# Patient Record
Sex: Male | Born: 1940
Health system: Southern US, Community
[De-identification: ages and names within clinical notes are randomized; demographics above are authoritative.]

## PROBLEM LIST (undated history)

## (undated) DIAGNOSIS — I10 Essential (primary) hypertension: Secondary | ICD-10-CM

## (undated) DIAGNOSIS — D126 Benign neoplasm of colon, unspecified: Secondary | ICD-10-CM

## (undated) DIAGNOSIS — Q231 Congenital insufficiency of aortic valve: Secondary | ICD-10-CM

## (undated) DIAGNOSIS — Q2381 Bicuspid aortic valve: Secondary | ICD-10-CM

## (undated) DIAGNOSIS — R931 Abnormal findings on diagnostic imaging of heart and coronary circulation: Secondary | ICD-10-CM

## (undated) DIAGNOSIS — E785 Hyperlipidemia, unspecified: Secondary | ICD-10-CM

## (undated) DIAGNOSIS — I700 Atherosclerosis of aorta: Secondary | ICD-10-CM

## (undated) DIAGNOSIS — I77819 Aortic ectasia, unspecified site: Secondary | ICD-10-CM

## (undated) HISTORY — DX: Benign neoplasm of colon, unspecified: D12.6

## (undated) HISTORY — PX: COLONOSCOPY W/ POLYPECTOMY: SHX1380

## (undated) HISTORY — PX: TONSILLECTOMY: SUR1361

## (undated) HISTORY — DX: Atherosclerosis of aorta: I70.0

## (undated) HISTORY — DX: Hyperlipidemia, unspecified: E78.5

## (undated) HISTORY — DX: Abnormal findings on diagnostic imaging of heart and coronary circulation: R93.1

## (undated) HISTORY — DX: Aortic ectasia, unspecified site: I77.819

## (undated) HISTORY — DX: Essential (primary) hypertension: I10

## (undated) HISTORY — DX: Bicuspid aortic valve: Q23.81

## (undated) HISTORY — PX: VASECTOMY: SHX75

## (undated) HISTORY — DX: Congenital insufficiency of aortic valve: Q23.1

## (undated) HISTORY — PX: UMBILICAL HERNIA REPAIR: SHX196

---

## 2011-05-25 LAB — PULMONARY FUNCTION TEST

## 2011-06-09 ENCOUNTER — Encounter: Payer: Self-pay | Admitting: Internal Medicine

## 2011-06-10 ENCOUNTER — Encounter: Payer: Self-pay | Admitting: Internal Medicine

## 2011-06-10 ENCOUNTER — Ambulatory Visit (INDEPENDENT_AMBULATORY_CARE_PROVIDER_SITE_OTHER): Payer: BC Managed Care – PPO | Admitting: Internal Medicine

## 2011-06-10 VITALS — BP 112/80 | HR 81 | Temp 98.3°F | Ht 73.0 in | Wt 227.0 lb

## 2011-06-10 DIAGNOSIS — J849 Interstitial pulmonary disease, unspecified: Secondary | ICD-10-CM | POA: Insufficient documentation

## 2011-06-10 DIAGNOSIS — R05 Cough: Secondary | ICD-10-CM

## 2011-06-10 MED ORDER — FAMOTIDINE 20 MG PO TABS: ORAL_TABLET | ORAL | Status: DC

## 2011-06-10 MED ORDER — PREDNISONE (PAK) 10 MG PO TABS: ORAL_TABLET | ORAL | Status: AC

## 2011-06-10 MED ORDER — TRAMADOL HCL 50 MG PO TABS: ORAL_TABLET | ORAL | Status: AC

## 2011-06-10 NOTE — Progress Notes (Signed)
  Subjective:    Patient ID: Stephen [name], male    DOB: 03/19/1941  MRN: [id number]  HPI  68 yowm quit smoking age late 20's with somewhat of a smoker's cough gradually improved p quit smoking and fine until Jan 2013 with onset of persistent cough so referred by [facility] 06/10/2011 to pulmonary clinic  06/10/2011 1st pulmonary eval cc indolent onset dry cough x 6 weeks waxed and waned really not progressive some better p course of prednisone, maintained now on spiriva , nexium, no better with saba.  Maybe some worse day vs night, sometimes when sits down, not assoc with chest tightness, sob, sinus or HB symptoms.  Sleeping ok without nocturnal  or early am exacerbation  of respiratory  c/o's or need for noct saba. Also denies any obvious fluctuation of symptoms with weather or environmental changes or other aggravating or alleviating factors except as outlined above    Review of Systems  Constitutional: Negative for fever, chills, activity change, appetite change and unexpected weight change.  HENT: Positive for congestion. Negative for sore throat, rhinorrhea, sneezing, trouble swallowing, dental problem, voice change and postnasal drip.   Eyes: Negative for visual disturbance.  Respiratory: Positive for cough. Negative for choking and shortness of breath.   Cardiovascular: Negative for chest pain and leg swelling.  Gastrointestinal: Negative for nausea, vomiting and abdominal pain.  Genitourinary: Negative for difficulty urinating.  Musculoskeletal: Negative for arthralgias.  Skin: Negative for rash.  Psychiatric/Behavioral: Negative for behavioral problems and confusion.       Objective:   Physical Exam  amb pleasant wm nad  227 06/10/11 HEENT: nl dentition, turbinates, and orophanx. Nl external ear canals without cough reflex   NECK :  without JVD/Nodes/TM/ nl carotid upstrokes bilaterally   LUNGS: no acc muscle use, clear to A and P bilaterally without cough on insp or exp  maneuvers   CV:  RRR  no s3 or murmur or increase in P2, no edema   ABD:  soft and nontender with nl excursion in the supine position. No bruits or organomegaly, bowel sounds nl  MS:  warm without deformities, calf tenderness, cyanosis or clubbing  SKIN: warm and dry without lesions    NEURO:  alert, approp, no deficits   cxr 05/25/11 "cm/copd" by report  Spirometry 05/25/11 no airflow obst     Assessment & Plan:

## 2011-06-10 NOTE — Patient Instructions (Signed)
The key to effective treatment for your cough is eliminating the non-stop cycle of cough you're stuck in long enough to let your airway heal completely and then see if there is anything still making you cough once you stop the cough suppression, but this should take no more than 5 days to figuure out  First take delsym two tsp every 12 hours and supplement if needed with  tramadol 50 mg up to 2 every 4 hours to suppress the urge to cough. Swallowing water or using ice chips/non mint and menthol containing candies (such as lifesavers or sugarless jolly ranchers) are also effective.  You should rest your voice and avoid activities that you know make you cough.  Once you have eliminated the cough for 3 straight days try reducing the tramadol first,  then the delsym as tolerated.    Try Nexium  Take 30-60 min before first meal of the day and Pepcid 20 mg one bedtime until cough is completely gone for at least a week without the need for cough suppression  I think of reflux for chronic cough like I do oxygen for fire (doesn't cause the fire but once you get the oxygen suppressed it usually goes away regardless of the exact cause).  GERD (REFLUX)  is an extremely common cause of respiratory symptoms, many times with no significant heartburn at all.    It can be treated with medication, but also with lifestyle changes including avoidance of late meals, excessive alcohol, smoking cessation, and avoid fatty foods, chocolate, peppermint, colas, red wine, and acidic juices such as orange juice.  NO MINT OR MENTHOL PRODUCTS SO NO COUGH DROPS  USE SUGARLESS CANDY INSTEAD (jolley ranchers or Stover's)  NO OIL BASED VITAMINS - use powdered substitutes.    Prednisone 10 mg take  4 each am x 2 days,   2 each am x 2 days,  1 each am x2days and stop     If you are satisfied with your treatment plan let your doctor know    If in any way you are not 100% satisfied,  please tell us.  If 100% better, tell your  friends!

## 2011-06-11 NOTE — Assessment & Plan Note (Signed)
The most common causes of chronic cough in immunocompetent adults include the following: upper airway cough syndrome (UACS), previously referred to as postnasal drip syndrome (PNDS), which is caused by variety of rhinosinus conditions; (2) asthma; (3) GERD; (4) chronic bronchitis from cigarette smoking or other inhaled environmental irritants; (5) nonasthmatic eosinophilic bronchitis; and (6) bronchiectasis.   These conditions, singly or in combination, have accounted for up to 94% of the causes of chronic cough in prospective studies.   Other conditions have constituted no >6% of the causes in prospective studies These have included bronchogenic carcinoma, chronic interstitial pneumonia, sarcoidosis, left ventricular failure, ACEI-induced cough, and aspiration from a condition associated with pharyngeal dysfunction.   .Chronic cough is often simultaneously caused by more than one condition. A single cause has been found from 38 to 82% of the time, multiple causes from 18 to 62%. Multiply caused cough has been the result of three diseases up to 42% of the time.     Of the three most common causes of chronic cough, only one (GERD)  can actually cause the other two (asthma and post nasal drip syndrome)  and perpetuate the cylce of cough inducing airway trauma, inflammation, heightened sensitivity to reflux which is prompted by the cough itself via a cyclical mechanism.    This may partially respond to steroids and look like asthma and post nasal drainage but never erradicated completely unless the cough and the secondary reflux are eliminated, preferably both at the same time.  While not intuitively obvious, many patients with chronic low grade reflux do not cough until there is a secondary insult that disturbs the protective epithelial barrier and exposes sensitive nerve endings.  This can be viral or direct physical injury such as with an endotracheal tube.   The point is that once this occurs, it is  difficult to eliminate using anything but a maximally effective acid suppression regimen at least in the short run, accompanied by an appropriate diet to address non acid GERD.   If not better p cyclical cough need to regroup with sinus ct and methacholine challenge  In absence of airflow obst will ask him to stop all inhalers.  Discussed with pt  The standardized cough guidelines published in Chest by [place] in 2006  are a multiple step process (up to 12!) , not a single office visit,  and are intended  to address this problem logically,  with an alogrithm dependent on response to empiric treatment at  each progressive step  to determine a specific diagnosis with  minimal addtional testing needed. Therefore if compliance is an issue or can't be accurately verified then it's very unlikely the standard evaluation and treatment will be successful here.    Furthermore, response to therapy (other than acute cough suppression, which should only be used short term with avoidance of narcotic containing cough syrups if possible), can be a gradual process for which the patient may not receive immediate benefit.  Unlike going to an eye doctor where the right rx is almost always the first one and is immediately effective, this is almost never the case in the management of chronic cough syndromes and the patient needs to commit up front to compliance with recommendations and have the patience to wait out a response for up to 6 weeks of therapy directed at the likely underlying problem(s).

## 2012-01-10 DIAGNOSIS — H25049 Posterior subcapsular polar age-related cataract, unspecified eye: Secondary | ICD-10-CM | POA: Diagnosis not present

## 2012-01-10 DIAGNOSIS — H2510 Age-related nuclear cataract, unspecified eye: Secondary | ICD-10-CM | POA: Diagnosis not present

## 2012-01-16 DIAGNOSIS — Z23 Encounter for immunization: Secondary | ICD-10-CM | POA: Diagnosis not present

## 2012-01-16 DIAGNOSIS — I719 Aortic aneurysm of unspecified site, without rupture: Secondary | ICD-10-CM | POA: Diagnosis not present

## 2012-01-16 DIAGNOSIS — I10 Essential (primary) hypertension: Secondary | ICD-10-CM | POA: Diagnosis not present

## 2012-01-16 DIAGNOSIS — Q231 Congenital insufficiency of aortic valve: Secondary | ICD-10-CM | POA: Diagnosis not present

## 2012-01-16 DIAGNOSIS — E669 Obesity, unspecified: Secondary | ICD-10-CM | POA: Diagnosis not present

## 2012-01-17 DIAGNOSIS — H3530 Unspecified macular degeneration: Secondary | ICD-10-CM | POA: Diagnosis not present

## 2012-01-17 DIAGNOSIS — H2510 Age-related nuclear cataract, unspecified eye: Secondary | ICD-10-CM | POA: Diagnosis not present

## 2012-01-17 DIAGNOSIS — H02839 Dermatochalasis of unspecified eye, unspecified eyelid: Secondary | ICD-10-CM | POA: Diagnosis not present

## 2012-01-17 DIAGNOSIS — H52209 Unspecified astigmatism, unspecified eye: Secondary | ICD-10-CM | POA: Diagnosis not present

## 2012-02-08 DIAGNOSIS — I10 Essential (primary) hypertension: Secondary | ICD-10-CM | POA: Diagnosis not present

## 2012-02-08 DIAGNOSIS — J309 Allergic rhinitis, unspecified: Secondary | ICD-10-CM | POA: Diagnosis not present

## 2012-02-08 DIAGNOSIS — Z125 Encounter for screening for malignant neoplasm of prostate: Secondary | ICD-10-CM | POA: Diagnosis not present

## 2012-02-08 DIAGNOSIS — D126 Benign neoplasm of colon, unspecified: Secondary | ICD-10-CM | POA: Diagnosis not present

## 2012-02-08 DIAGNOSIS — Z1331 Encounter for screening for depression: Secondary | ICD-10-CM | POA: Diagnosis not present

## 2012-02-08 DIAGNOSIS — Z23 Encounter for immunization: Secondary | ICD-10-CM | POA: Diagnosis not present

## 2012-02-08 DIAGNOSIS — Z0000 Encounter for general adult medical examination without abnormal findings: Secondary | ICD-10-CM | POA: Diagnosis not present

## 2012-02-09 DIAGNOSIS — Z0000 Encounter for general adult medical examination without abnormal findings: Secondary | ICD-10-CM | POA: Diagnosis not present

## 2012-02-09 DIAGNOSIS — I10 Essential (primary) hypertension: Secondary | ICD-10-CM | POA: Diagnosis not present

## 2012-02-09 DIAGNOSIS — J309 Allergic rhinitis, unspecified: Secondary | ICD-10-CM | POA: Diagnosis not present

## 2012-02-09 DIAGNOSIS — Z125 Encounter for screening for malignant neoplasm of prostate: Secondary | ICD-10-CM | POA: Diagnosis not present

## 2012-02-09 DIAGNOSIS — D126 Benign neoplasm of colon, unspecified: Secondary | ICD-10-CM | POA: Diagnosis not present

## 2012-02-15 DIAGNOSIS — H3530 Unspecified macular degeneration: Secondary | ICD-10-CM | POA: Diagnosis not present

## 2012-02-15 DIAGNOSIS — H25019 Cortical age-related cataract, unspecified eye: Secondary | ICD-10-CM | POA: Diagnosis not present

## 2012-02-15 DIAGNOSIS — H2181 Floppy iris syndrome: Secondary | ICD-10-CM | POA: Diagnosis not present

## 2012-02-15 DIAGNOSIS — H2510 Age-related nuclear cataract, unspecified eye: Secondary | ICD-10-CM | POA: Diagnosis not present

## 2012-02-15 DIAGNOSIS — H2589 Other age-related cataract: Secondary | ICD-10-CM | POA: Diagnosis not present

## 2012-03-07 DIAGNOSIS — Z23 Encounter for immunization: Secondary | ICD-10-CM | POA: Diagnosis not present

## 2012-04-13 DIAGNOSIS — L723 Sebaceous cyst: Secondary | ICD-10-CM | POA: Diagnosis not present

## 2012-05-02 DIAGNOSIS — Z8669 Personal history of other diseases of the nervous system and sense organs: Secondary | ICD-10-CM | POA: Diagnosis not present

## 2012-05-02 DIAGNOSIS — H2510 Age-related nuclear cataract, unspecified eye: Secondary | ICD-10-CM | POA: Diagnosis not present

## 2012-05-02 DIAGNOSIS — H2589 Other age-related cataract: Secondary | ICD-10-CM | POA: Diagnosis not present

## 2012-05-02 DIAGNOSIS — H25019 Cortical age-related cataract, unspecified eye: Secondary | ICD-10-CM | POA: Diagnosis not present

## 2012-05-02 DIAGNOSIS — H3530 Unspecified macular degeneration: Secondary | ICD-10-CM | POA: Diagnosis not present

## 2012-07-07 ENCOUNTER — Other Ambulatory Visit: Payer: Self-pay | Admitting: Internal Medicine

## 2012-07-11 ENCOUNTER — Other Ambulatory Visit: Payer: Self-pay | Admitting: Internal Medicine

## 2012-07-16 DIAGNOSIS — I77819 Aortic ectasia, unspecified site: Secondary | ICD-10-CM | POA: Diagnosis not present

## 2012-07-16 DIAGNOSIS — Q231 Congenital insufficiency of aortic valve: Secondary | ICD-10-CM | POA: Diagnosis not present

## 2012-07-16 DIAGNOSIS — I10 Essential (primary) hypertension: Secondary | ICD-10-CM | POA: Diagnosis not present

## 2012-07-17 DIAGNOSIS — L259 Unspecified contact dermatitis, unspecified cause: Secondary | ICD-10-CM | POA: Diagnosis not present

## 2012-09-19 DIAGNOSIS — L259 Unspecified contact dermatitis, unspecified cause: Secondary | ICD-10-CM | POA: Diagnosis not present

## 2012-11-07 DIAGNOSIS — D235 Other benign neoplasm of skin of trunk: Secondary | ICD-10-CM | POA: Diagnosis not present

## 2012-11-07 DIAGNOSIS — L259 Unspecified contact dermatitis, unspecified cause: Secondary | ICD-10-CM | POA: Diagnosis not present

## 2012-11-07 DIAGNOSIS — L738 Other specified follicular disorders: Secondary | ICD-10-CM | POA: Diagnosis not present

## 2013-01-05 ENCOUNTER — Encounter: Payer: Self-pay | Admitting: Cardiology

## 2013-01-05 DIAGNOSIS — J309 Allergic rhinitis, unspecified: Secondary | ICD-10-CM

## 2013-01-05 DIAGNOSIS — K635 Polyp of colon: Secondary | ICD-10-CM

## 2013-01-05 DIAGNOSIS — I10 Essential (primary) hypertension: Secondary | ICD-10-CM

## 2013-01-05 DIAGNOSIS — I77810 Thoracic aortic ectasia: Secondary | ICD-10-CM

## 2013-01-05 DIAGNOSIS — Q231 Congenital insufficiency of aortic valve: Secondary | ICD-10-CM

## 2013-01-06 ENCOUNTER — Encounter: Payer: Self-pay | Admitting: Cardiology

## 2013-01-06 DIAGNOSIS — Q231 Congenital insufficiency of aortic valve: Secondary | ICD-10-CM | POA: Insufficient documentation

## 2013-01-06 DIAGNOSIS — K635 Polyp of colon: Secondary | ICD-10-CM | POA: Insufficient documentation

## 2013-01-06 DIAGNOSIS — I10 Essential (primary) hypertension: Secondary | ICD-10-CM | POA: Insufficient documentation

## 2013-01-06 DIAGNOSIS — J309 Allergic rhinitis, unspecified: Secondary | ICD-10-CM | POA: Insufficient documentation

## 2013-01-06 DIAGNOSIS — I77810 Thoracic aortic ectasia: Secondary | ICD-10-CM | POA: Insufficient documentation

## 2013-01-15 ENCOUNTER — Other Ambulatory Visit (HOSPITAL_COMMUNITY): Payer: Self-pay | Admitting: Cardiology

## 2013-01-15 DIAGNOSIS — I359 Nonrheumatic aortic valve disorder, unspecified: Secondary | ICD-10-CM

## 2013-01-16 ENCOUNTER — Other Ambulatory Visit: Payer: Self-pay

## 2013-01-16 ENCOUNTER — Encounter: Payer: Self-pay | Admitting: Cardiology

## 2013-01-16 ENCOUNTER — Ambulatory Visit (INDEPENDENT_AMBULATORY_CARE_PROVIDER_SITE_OTHER): Payer: Medicare Other | Admitting: Cardiology

## 2013-01-16 ENCOUNTER — Ambulatory Visit (HOSPITAL_COMMUNITY): Payer: Medicare Other | Attending: Cardiovascular Disease | Admitting: Radiology

## 2013-01-16 ENCOUNTER — Other Ambulatory Visit (HOSPITAL_COMMUNITY): Payer: Self-pay | Admitting: Cardiology

## 2013-01-16 VITALS — BP 113/82 | HR 67 | Ht 73.0 in | Wt 236.0 lb

## 2013-01-16 DIAGNOSIS — Q231 Congenital insufficiency of aortic valve: Secondary | ICD-10-CM | POA: Diagnosis not present

## 2013-01-16 DIAGNOSIS — Z87891 Personal history of nicotine dependence: Secondary | ICD-10-CM | POA: Diagnosis not present

## 2013-01-16 DIAGNOSIS — I77810 Thoracic aortic ectasia: Secondary | ICD-10-CM

## 2013-01-16 DIAGNOSIS — I10 Essential (primary) hypertension: Secondary | ICD-10-CM | POA: Diagnosis not present

## 2013-01-16 DIAGNOSIS — I77819 Aortic ectasia, unspecified site: Secondary | ICD-10-CM | POA: Insufficient documentation

## 2013-01-16 DIAGNOSIS — I359 Nonrheumatic aortic valve disorder, unspecified: Secondary | ICD-10-CM

## 2013-01-16 DIAGNOSIS — I079 Rheumatic tricuspid valve disease, unspecified: Secondary | ICD-10-CM | POA: Diagnosis not present

## 2013-01-16 NOTE — Progress Notes (Addendum)
History of Present Illness:The patient presents today for followup of his HTN, bicuspid AV and dilated aorta.  He denies any chest pain, SOB, DOE, LE edema, dizziness, palpitations or syncope.  He walks a little for exercise.  Primary Care Physician:  Dr. Clarene Duke    Past Medical History  Diagnosis Date  . Hypertension   . Allergic rhinitis   . Adenomatous colon polyp   . Bicuspid aortic valve   . Aortic dilatation     Past Surgical History  Procedure Laterality Date  . Tonsillectomy    . Vasectomy    . Umbilical hernia repair    . Colonoscopy w/ polypectomy      Current Outpatient Prescriptions  Medication Sig Dispense Refill  . amLODipine (NORVASC) 5 MG tablet Take 5 mg by mouth daily.      Marland Kitchen aspirin 81 MG tablet Take 81 mg by mouth daily.      Marland Kitchen esomeprazole (NEXIUM) 40 MG capsule Take 40 mg by mouth daily.      . Glucosamine 500 MG CAPS Take 1 capsule by mouth daily.      Marland Kitchen loratadine (CLARITIN) 10 MG tablet Take 10 mg by mouth daily.      Marland Kitchen losartan (COZAAR) 50 MG tablet Take 100 mg by mouth daily.       . Multiple Vitamin (MULTIVITAMIN) capsule Take 1 capsule by mouth daily.      . permethrin (ELIMITE) 5 % cream Apply 1 application topically as directed.      . PredniSONE (STERAPRED 12 DAY PO) Take 10 mg by mouth as directed.      . Tamsulosin HCl (FLOMAX) 0.4 MG CAPS Take 0.4 mg by mouth daily.      Marland Kitchen triamcinolone (KENALOG) 0.025 % cream Apply 1 application topically 2 (two) times daily.       No current facility-administered medications for this visit.    Allergies  Allergen Reactions  . Amlodipine     Peripheral edema  . Amoxicillin     hives    History   Social History  . Marital Status: Married    Spouse Name: N/A    Number of Children: N/A  . Years of Education: N/A   Occupational History  . mail clerk    Social History Main Topics  . Smoking status: Former Smoker -- 1.00 packs/day for 10 years    Types: Cigarettes    Quit date: 04/18/1968    . Smokeless tobacco: Not on file  . Alcohol Use: 1.2 oz/week    2 Cans of beer per week  . Drug Use: No  . Sexual Activity: Not on file   Other Topics Concern  . Not on file   Social History Narrative  . No narrative on file    Family History  Problem Relation Age of Onset  . Heart attack Father   . Heart failure Father   . Breast cancer Sister   . Uterine cancer Sister     Review of Systems:  As stated in the HPI and otherwise negative.   There were no vitals taken for this visit.  Physical Examination: General: Well developed, well nourished, NAD HEENT: OP clear, mucus membranes moist SKIN: warm, dry. No rashes. Neuro: No focal deficits Musculoskeletal: Muscle strength 5/5 all ext Psychiatric: Mood and affect normal Neck: No JVD, no carotid bruits, no thyromegaly, no lymphadenopathy. Lungs:Clear bilaterally, no wheezes, rhonci, crackles Cardiovascular: Regular rate and rhythm. No murmurs, gallops or rubs. Occasional Ectopy Abdomen:Soft. Bowel  sounds present. Non-tender.  Extremities: No lower extremity edema. Pulses are 2 + in the bilateral DP/PT.h   EKG:  NSR with PAC's  Assessment and Plan:   1.  HTN - controlled  -continue Amlodipine/Cozaar 2.  Bicuspid AV - 2D echo completed today 3.  Dilated aortic root -  - Will check results of echo today   Followup in  6 months  Signed: Armanda Magic, MD 01/16/2013

## 2013-01-16 NOTE — Progress Notes (Signed)
Echocardiogram performed.  

## 2013-01-16 NOTE — Patient Instructions (Signed)
Your physician recommends that you schedule a follow-up appointment in: 6 MONTH WITH DR. Mayford Knife

## 2013-02-13 DIAGNOSIS — Z23 Encounter for immunization: Secondary | ICD-10-CM | POA: Diagnosis not present

## 2013-02-20 DIAGNOSIS — L259 Unspecified contact dermatitis, unspecified cause: Secondary | ICD-10-CM | POA: Diagnosis not present

## 2013-05-29 ENCOUNTER — Other Ambulatory Visit (HOSPITAL_COMMUNITY): Payer: Self-pay | Admitting: Cardiology

## 2013-05-30 NOTE — Telephone Encounter (Signed)
Can you clarify how the patient should be taking this? Thanks, MI

## 2013-06-20 DIAGNOSIS — H02839 Dermatochalasis of unspecified eye, unspecified eyelid: Secondary | ICD-10-CM | POA: Diagnosis not present

## 2013-06-20 DIAGNOSIS — H3530 Unspecified macular degeneration: Secondary | ICD-10-CM | POA: Diagnosis not present

## 2013-06-20 DIAGNOSIS — Z961 Presence of intraocular lens: Secondary | ICD-10-CM | POA: Diagnosis not present

## 2013-06-28 DIAGNOSIS — L738 Other specified follicular disorders: Secondary | ICD-10-CM | POA: Diagnosis not present

## 2013-06-28 DIAGNOSIS — L259 Unspecified contact dermatitis, unspecified cause: Secondary | ICD-10-CM | POA: Diagnosis not present

## 2013-06-28 DIAGNOSIS — L678 Other hair color and hair shaft abnormalities: Secondary | ICD-10-CM | POA: Diagnosis not present

## 2014-01-02 DIAGNOSIS — I10 Essential (primary) hypertension: Secondary | ICD-10-CM | POA: Diagnosis not present

## 2014-01-02 DIAGNOSIS — D126 Benign neoplasm of colon, unspecified: Secondary | ICD-10-CM | POA: Diagnosis not present

## 2014-01-02 DIAGNOSIS — Z23 Encounter for immunization: Secondary | ICD-10-CM | POA: Diagnosis not present

## 2014-01-02 DIAGNOSIS — I77810 Thoracic aortic ectasia: Secondary | ICD-10-CM | POA: Diagnosis not present

## 2014-03-20 ENCOUNTER — Telehealth: Payer: Self-pay | Admitting: Cardiology

## 2014-03-20 NOTE — Telephone Encounter (Signed)
New Message        Pt calling stating he received a recall letter in regards to scheduling an echocardiogram, there is no order in the system and no recall. Please call pt back and advise.

## 2014-03-20 NOTE — Telephone Encounter (Signed)
Left message for patient to call and schedule OV with Dr. Radford Pax at his convenience.  There is no documentation of needed repeat ECHO but he is 6 months overdue for his 6 month F/U.

## 2014-03-26 DIAGNOSIS — Z23 Encounter for immunization: Secondary | ICD-10-CM | POA: Diagnosis not present

## 2014-03-26 DIAGNOSIS — I10 Essential (primary) hypertension: Secondary | ICD-10-CM | POA: Diagnosis not present

## 2014-06-09 ENCOUNTER — Other Ambulatory Visit: Payer: Self-pay | Admitting: Cardiology

## 2014-06-10 ENCOUNTER — Other Ambulatory Visit: Payer: Self-pay | Admitting: Cardiology

## 2014-06-24 DIAGNOSIS — H3531 Nonexudative age-related macular degeneration: Secondary | ICD-10-CM | POA: Diagnosis not present

## 2014-06-24 DIAGNOSIS — H26491 Other secondary cataract, right eye: Secondary | ICD-10-CM | POA: Diagnosis not present

## 2014-06-24 DIAGNOSIS — H02831 Dermatochalasis of right upper eyelid: Secondary | ICD-10-CM | POA: Diagnosis not present

## 2014-06-24 DIAGNOSIS — H02834 Dermatochalasis of left upper eyelid: Secondary | ICD-10-CM | POA: Diagnosis not present

## 2014-08-04 ENCOUNTER — Other Ambulatory Visit: Payer: Self-pay | Admitting: Cardiology

## 2014-08-24 ENCOUNTER — Other Ambulatory Visit: Payer: Self-pay | Admitting: Cardiology

## 2014-08-25 NOTE — Telephone Encounter (Signed)
Please advise on refill. Patient has not been seen since 2014 and his last rx was only for a two week supply. Thanks, MI

## 2014-08-27 ENCOUNTER — Other Ambulatory Visit: Payer: Self-pay | Admitting: *Deleted

## 2014-08-27 MED ORDER — AMLODIPINE BESYLATE 5 MG PO TABS: ORAL_TABLET | ORAL | Status: DC

## 2014-10-15 ENCOUNTER — Encounter: Payer: Self-pay | Admitting: *Deleted

## 2014-10-16 NOTE — Progress Notes (Signed)
Cardiology Office Note   Date:  10/17/2014   ID:  Stephen Stephen [name], DOB June 13, 1940, MRN [id number]  PCP:  Stephen Pac, MD    Chief Complaint  Stephen [name] presents with  . Hypertension  . Bicuspid aortic vavle      History of Present Illness: Stephen Stephen [name] is a 74 y.o. male who presents for followup of his HTN, bicuspid AV and dilated aorta. He denies any chest pain, SOB, DOE, dizziness, palpitations or syncope. He walks a little for exercise at a recreation center and uses Stephen machines.  He has some LE edema on occasion.      Past Medical History  Diagnosis Date  . Hypertension   . Allergic rhinitis   . Adenomatous colon polyp   . Bicuspid aortic valve   . Aortic dilatation     Past Surgical History  Procedure Laterality Date  . Tonsillectomy    . Vasectomy    . Umbilical hernia repair    . Colonoscopy w/ polypectomy       Current Outpatient Prescriptions  Medication Sig Dispense Refill  . amLODipine (NORVASC) 5 MG tablet TAKE 1 TABLET BY MOUTH EVERY DAY. 30 tablet 1  . aspirin 81 MG tablet Take 81 mg by mouth daily.    Marland Kitchen esomeprazole (NEXIUM) 40 MG capsule Take 40 mg by mouth daily.    Marland Kitchen loratadine (CLARITIN) 10 MG tablet Take 10 mg by mouth daily.    Marland Kitchen losartan (COZAAR) 100 MG tablet Take 100 mg by mouth daily.  0  . Multiple Vitamin (MULTIVITAMIN) capsule Take 1 capsule by mouth daily.    . Multiple Vitamins-Minerals (EYE VITAMINS PO) Take by mouth.    . Tamsulosin HCl (FLOMAX) 0.4 MG CAPS Take 0.4 mg by mouth daily.     No current facility-administered medications for this visit.    Allergies:   Amlodipine and Amoxicillin    Social History:  Stephen Stephen [name]  reports that he quit smoking about 46 years ago. His smoking use included Cigarettes. He has a 10 pack-year smoking history. He does not have any smokeless tobacco history on file. He reports that he drinks about 1.2 oz of alcohol per week. He reports that he does not use illicit drugs.    Family History:  Stephen Stephen [name]'s family history includes Breast cancer in his sister; Heart attack in his father; Heart failure in his father; Uterine cancer in his sister.    ROS:  Please see Stephen history of present illness.   Otherwise, review of systems are positive for none.   All other systems are reviewed and negative.    PHYSICAL EXAM: VS:  BP 128/82 mmHg  Pulse 63  Ht 6\' 1"  (1.854 m)  Wt 237 lb (107.502 kg)  BMI 31.27 kg/m2 , BMI Body mass index is 31.27 kg/(m^2). GEN: Well nourished, well developed, in no acute distress HEENT: normal Neck: no JVD, carotid bruits, or masses Cardiac: 2RRR; no murmurs, rubs, or gallops,no edema  Respiratory:  clear to auscultation bilaterally, normal work of breathing GI: soft, nontender, nondistended, + BS MS: no deformity or atrophy Skin: warm and dry, no rash Neuro:  Strength and sensation are intact Psych: euthymic mood, full affect   EKG:  EKG was ordered today showing NSR with inferior infarct and no ST changes    Recent Labs: No results found for requested labs within last 365 days.    Lipid Panel  No results found for: CHOL, TRIG, HDL, CHOLHDL, VLDL, LDLCALC, LDLDIRECT    Wt Readings from Last 3 Encounters:  10/17/14 237 lb (107.502 kg)  01/16/13 236 lb (107.049 kg)  06/10/11 227 lb (102.967 kg)    Assessment and Plan:   1. HTN - controlled -continue Amlodipine/Cozaar 2. Bicuspid AV - recheck echo 3. Dilated aortic root  - Will check echo to assess for stability 4. Abnormal EKG with new Q waves in III and aVF.  I will get a Stress myoview to rule out ischemia     Current medicines are reviewed at length with Stephen Stephen [name] today.  Stephen Stephen [name] does not have concerns regarding medicines.  Stephen following changes have been made:  no change  Labs/ tests ordered today: See above Assessment and Plan No orders of Stephen defined types were placed in this encounter.     Disposition:   FU with me in  1 year  Signed, Sueanne Margarita, MD  10/17/2014 8:27 AM    [facility] [address], [place], [place]  [address] Phone: [phone]; Fax: [phone]

## 2014-10-17 ENCOUNTER — Ambulatory Visit (INDEPENDENT_AMBULATORY_CARE_PROVIDER_SITE_OTHER): Payer: Medicare Other | Admitting: Cardiology

## 2014-10-17 ENCOUNTER — Other Ambulatory Visit: Payer: Self-pay | Admitting: *Deleted

## 2014-10-17 ENCOUNTER — Encounter: Payer: Self-pay | Admitting: Cardiology

## 2014-10-17 VITALS — BP 128/82 | HR 63 | Ht 73.0 in | Wt 237.0 lb

## 2014-10-17 DIAGNOSIS — Q231 Congenital insufficiency of aortic valve: Secondary | ICD-10-CM | POA: Diagnosis not present

## 2014-10-17 DIAGNOSIS — R9431 Abnormal electrocardiogram [ECG] [EKG]: Secondary | ICD-10-CM

## 2014-10-17 DIAGNOSIS — I10 Essential (primary) hypertension: Secondary | ICD-10-CM

## 2014-10-17 DIAGNOSIS — I77810 Thoracic aortic ectasia: Secondary | ICD-10-CM | POA: Diagnosis not present

## 2014-10-17 NOTE — Patient Instructions (Signed)
Medication Instructions:  Your physician recommends that you continue on your current medications as directed. Please refer to the Current Medication list given to you today.   Labwork: NONE  Testing/Procedures: Your physician has requested that you have en exercise stress myoview. For further information please visit [email or web address]. Please follow instruction sheet, as given.  Your physician has requested that you have an echocardiogram. Echocardiography is a painless test that uses sound waves to create images of your heart. It provides your doctor with information about the size and shape of your heart and how well your heart's chambers and valves are working. This procedure takes approximately one hour. There are no restrictions for this procedure.   Follow-Up: Your physician wants you to follow-up in: 12 months with Dr. Radford Pax. You will receive a reminder letter in the mail two months in advance. If you don't receive a letter, please call our office to schedule the follow-up appointment.   Any Other Special Instructions Will Be Listed Below (If Applicable).

## 2014-10-17 NOTE — Addendum Note (Signed)
Addended by: Freada Bergeron on: 10/17/2014 05:25 PM   Modules accepted: Miquel Dunn

## 2014-11-08 ENCOUNTER — Other Ambulatory Visit: Payer: Self-pay | Admitting: Cardiology

## 2014-11-11 ENCOUNTER — Other Ambulatory Visit: Payer: Self-pay

## 2014-11-11 MED ORDER — AMLODIPINE BESYLATE 5 MG PO TABS: ORAL_TABLET | ORAL | Status: DC

## 2014-12-24 DIAGNOSIS — K635 Polyp of colon: Secondary | ICD-10-CM | POA: Diagnosis not present

## 2014-12-24 DIAGNOSIS — Z125 Encounter for screening for malignant neoplasm of prostate: Secondary | ICD-10-CM | POA: Diagnosis not present

## 2014-12-24 DIAGNOSIS — I77810 Thoracic aortic ectasia: Secondary | ICD-10-CM | POA: Diagnosis not present

## 2014-12-24 DIAGNOSIS — Z23 Encounter for immunization: Secondary | ICD-10-CM | POA: Diagnosis not present

## 2014-12-24 DIAGNOSIS — I10 Essential (primary) hypertension: Secondary | ICD-10-CM | POA: Diagnosis not present

## 2015-01-15 ENCOUNTER — Telehealth (HOSPITAL_COMMUNITY): Payer: Self-pay | Admitting: *Deleted

## 2015-01-15 NOTE — Telephone Encounter (Signed)
Patient given detailed instructions per Myocardial Perfusion Study Information Sheet for test on 01/19/15 at 830. Patient notified to arrive 15 minutes early and that it is imperative to arrive on time for appointment to keep from having the test rescheduled.  If you need to cancel or reschedule your appointment, please call the office within 24 hours of your appointment. Failure to do so may result in a cancellation of your appointment, and a $50 no show fee. Patient verbalized understanding. Hubbard Robinson, RN

## 2015-01-19 ENCOUNTER — Ambulatory Visit (HOSPITAL_COMMUNITY): Payer: Medicare Other | Attending: Cardiology

## 2015-01-19 ENCOUNTER — Other Ambulatory Visit: Payer: Self-pay

## 2015-01-19 ENCOUNTER — Ambulatory Visit (HOSPITAL_BASED_OUTPATIENT_CLINIC_OR_DEPARTMENT_OTHER): Payer: Medicare Other

## 2015-01-19 ENCOUNTER — Telehealth: Payer: Self-pay

## 2015-01-19 DIAGNOSIS — Z6831 Body mass index (BMI) 31.0-31.9, adult: Secondary | ICD-10-CM | POA: Diagnosis not present

## 2015-01-19 DIAGNOSIS — I5189 Other ill-defined heart diseases: Secondary | ICD-10-CM | POA: Insufficient documentation

## 2015-01-19 DIAGNOSIS — I517 Cardiomegaly: Secondary | ICD-10-CM | POA: Insufficient documentation

## 2015-01-19 DIAGNOSIS — I10 Essential (primary) hypertension: Secondary | ICD-10-CM

## 2015-01-19 DIAGNOSIS — R9431 Abnormal electrocardiogram [ECG] [EKG]: Secondary | ICD-10-CM

## 2015-01-19 DIAGNOSIS — I351 Nonrheumatic aortic (valve) insufficiency: Secondary | ICD-10-CM | POA: Diagnosis not present

## 2015-01-19 DIAGNOSIS — I77810 Thoracic aortic ectasia: Secondary | ICD-10-CM | POA: Insufficient documentation

## 2015-01-19 DIAGNOSIS — Q231 Congenital insufficiency of aortic valve: Secondary | ICD-10-CM | POA: Insufficient documentation

## 2015-01-19 DIAGNOSIS — E669 Obesity, unspecified: Secondary | ICD-10-CM | POA: Diagnosis not present

## 2015-01-19 LAB — MYOCARDIAL PERFUSION IMAGING
CHL CUP NUCLEAR SDS: 0
CHL CUP NUCLEAR SSS: 5
LHR: 0.36
LV dias vol: 130 mL
LV sys vol: 53 mL
Peak HR: 117 {beats}/min
Rest HR: 56 {beats}/min
SRS: 5
TID: 1.16

## 2015-01-19 MED ORDER — TECHNETIUM TC 99M SESTAMIBI GENERIC - CARDIOLITE
10.9000 | Freq: Once | INTRAVENOUS | Status: AC | PRN
  Administered 2015-01-19: 10.9 via INTRAVENOUS

## 2015-01-19 MED ORDER — TECHNETIUM TC 99M SESTAMIBI GENERIC - CARDIOLITE
32.7000 | Freq: Once | INTRAVENOUS | Status: AC | PRN
  Administered 2015-01-19: 32.7 via INTRAVENOUS

## 2015-01-19 MED ORDER — REGADENOSON 0.4 MG/5ML IV SOLN
0.4000 mg | Freq: Once | INTRAVENOUS | Status: AC
  Administered 2015-01-19: 0.4 mg via INTRAVENOUS

## 2015-01-19 NOTE — Telephone Encounter (Signed)
Informed patient of results and verbal understanding expressed.   Repeat ECHO ordered to be scheduled in 2 years. Patient agrees with treatment plan. 

## 2015-01-19 NOTE — Telephone Encounter (Signed)
-----   Message from Sueanne Margarita, MD sent at 01/19/2015 11:37 AM EDT ----- Echo showed normal LVF with bicuspid AV with no AS but mild AR mild LVH, - repeat echo in 2 years

## 2015-06-29 DIAGNOSIS — H26493 Other secondary cataract, bilateral: Secondary | ICD-10-CM | POA: Diagnosis not present

## 2015-06-29 DIAGNOSIS — H353132 Nonexudative age-related macular degeneration, bilateral, intermediate dry stage: Secondary | ICD-10-CM | POA: Diagnosis not present

## 2015-09-28 ENCOUNTER — Other Ambulatory Visit: Payer: Self-pay | Admitting: Cardiology

## 2015-09-29 ENCOUNTER — Other Ambulatory Visit: Payer: Self-pay | Admitting: Cardiology

## 2015-10-28 ENCOUNTER — Other Ambulatory Visit: Payer: Self-pay | Admitting: Cardiology

## 2015-11-25 ENCOUNTER — Other Ambulatory Visit: Payer: Self-pay | Admitting: Cardiology

## 2016-01-05 ENCOUNTER — Other Ambulatory Visit: Payer: Self-pay | Admitting: Cardiology

## 2016-02-02 ENCOUNTER — Other Ambulatory Visit: Payer: Self-pay | Admitting: Cardiology

## 2016-02-04 ENCOUNTER — Other Ambulatory Visit: Payer: Self-pay | Admitting: *Deleted

## 2016-02-04 MED ORDER — AMLODIPINE BESYLATE 5 MG PO TABS: 5.0000 mg | ORAL_TABLET | Freq: Every day | ORAL | 0 refills | Status: DC

## 2016-02-05 DIAGNOSIS — Z23 Encounter for immunization: Secondary | ICD-10-CM | POA: Diagnosis not present

## 2016-02-16 DIAGNOSIS — D126 Benign neoplasm of colon, unspecified: Secondary | ICD-10-CM | POA: Diagnosis not present

## 2016-02-16 DIAGNOSIS — K5730 Diverticulosis of large intestine without perforation or abscess without bleeding: Secondary | ICD-10-CM | POA: Diagnosis not present

## 2016-02-16 DIAGNOSIS — Z86010 Personal history of colonic polyps: Secondary | ICD-10-CM | POA: Diagnosis not present

## 2016-02-16 DIAGNOSIS — D122 Benign neoplasm of ascending colon: Secondary | ICD-10-CM | POA: Diagnosis not present

## 2016-02-18 DIAGNOSIS — D126 Benign neoplasm of colon, unspecified: Secondary | ICD-10-CM | POA: Diagnosis not present

## 2016-03-06 ENCOUNTER — Other Ambulatory Visit: Payer: Self-pay | Admitting: Cardiology

## 2016-03-08 ENCOUNTER — Other Ambulatory Visit: Payer: Self-pay | Admitting: Cardiology

## 2016-03-24 ENCOUNTER — Other Ambulatory Visit: Payer: Self-pay | Admitting: Cardiology

## 2016-04-07 ENCOUNTER — Other Ambulatory Visit: Payer: Self-pay | Admitting: Cardiology

## 2016-04-07 NOTE — Telephone Encounter (Signed)
amLODipine (NORVASC) 5 MG tablet  Medication  Date: 03/24/2016 Department: [address] Office Ordering/Authorizing: Sueanne Margarita, MD  Order Providers   Prescribing Provider Encounter Provider  Sueanne Margarita, MD Sueanne Margarita, MD  Medication Detail    Disp Refills Start End   amLODipine (NORVASC) 5 MG tablet 15 tablet 0 03/24/2016    Sig - Route: Take 1 tablet (5 mg total) by mouth daily. *Please keep 05/10/16 appointment for further refills* - Oral   E-Prescribing Status: Receipt confirmed by pharmacy (03/24/2016 4:53 PM EST)   Pharmacy   [id number] - , [place] - [address] AT [place]

## 2016-04-12 ENCOUNTER — Other Ambulatory Visit: Payer: Self-pay

## 2016-04-12 MED ORDER — AMLODIPINE BESYLATE 5 MG PO TABS: 5.0000 mg | ORAL_TABLET | Freq: Every day | ORAL | 0 refills | Status: DC

## 2016-04-14 ENCOUNTER — Encounter: Payer: Self-pay | Admitting: Cardiology

## 2016-04-14 DIAGNOSIS — Z125 Encounter for screening for malignant neoplasm of prostate: Secondary | ICD-10-CM | POA: Diagnosis not present

## 2016-04-14 DIAGNOSIS — I10 Essential (primary) hypertension: Secondary | ICD-10-CM | POA: Diagnosis not present

## 2016-04-14 DIAGNOSIS — Z79899 Other long term (current) drug therapy: Secondary | ICD-10-CM | POA: Diagnosis not present

## 2016-04-14 DIAGNOSIS — I77810 Thoracic aortic ectasia: Secondary | ICD-10-CM | POA: Diagnosis not present

## 2016-04-14 DIAGNOSIS — J309 Allergic rhinitis, unspecified: Secondary | ICD-10-CM | POA: Diagnosis not present

## 2016-04-14 DIAGNOSIS — Z86010 Personal history of colonic polyps: Secondary | ICD-10-CM | POA: Diagnosis not present

## 2016-04-25 ENCOUNTER — Encounter: Payer: Self-pay | Admitting: *Deleted

## 2016-04-26 ENCOUNTER — Other Ambulatory Visit: Payer: Self-pay | Admitting: *Deleted

## 2016-04-26 ENCOUNTER — Other Ambulatory Visit: Payer: Self-pay | Admitting: Cardiology

## 2016-04-26 MED ORDER — AMLODIPINE BESYLATE 5 MG PO TABS: 5.0000 mg | ORAL_TABLET | Freq: Every day | ORAL | 0 refills | Status: DC

## 2016-04-28 NOTE — Telephone Encounter (Signed)
Medication Detail    Disp Refills Start End   amLODipine (NORVASC) 5 MG tablet 15 tablet 0 04/26/2016    Sig - Route: Take 1 tablet (5 mg total) by mouth daily. *Please keep 05/10/16 appointment for further refills* - Oral   E-Prescribing Status: Receipt confirmed by pharmacy (04/26/2016 12:28 PM EST)   Pharmacy   [id number] - [place],  - [address] AT [place]

## 2016-05-10 ENCOUNTER — Encounter: Payer: Self-pay | Admitting: Cardiology

## 2016-05-10 ENCOUNTER — Ambulatory Visit (INDEPENDENT_AMBULATORY_CARE_PROVIDER_SITE_OTHER): Payer: Medicare Other | Admitting: Cardiology

## 2016-05-10 VITALS — BP 118/72 | HR 59 | Ht 73.0 in | Wt 232.6 lb

## 2016-05-10 DIAGNOSIS — I10 Essential (primary) hypertension: Secondary | ICD-10-CM

## 2016-05-10 DIAGNOSIS — Q231 Congenital insufficiency of aortic valve: Secondary | ICD-10-CM | POA: Diagnosis not present

## 2016-05-10 DIAGNOSIS — I77810 Thoracic aortic ectasia: Secondary | ICD-10-CM

## 2016-05-10 NOTE — Patient Instructions (Signed)

## 2016-05-10 NOTE — Progress Notes (Signed)
Cardiology Office Note    Date:  05/10/2016   ID:  Stephen [name], DOB May 09, 1940, MRN [id number]  PCP:  Gennette Pac, MD  Cardiologist:  Fransico Him, MD   Chief Complaint  Patient presents with  . Follow-up    bicuspid AV, dilated aortic root, HTN    History of Present Illness:  Stephen [name] is a 76 y.o. male  who presents for followup of his HTN, bicuspid AV and dilated aorta. He denies any chest pain, SOB, DOE, PND, orthopnea, dizziness, palpitations or syncope. He denies any claudication symptoms.  He walks a little for exercise at a recreation center and uses the machines.  He has some LE edema on occasion but thinks this has improved over the past few years.     Past Medical History:  Diagnosis Date  . Adenomatous colon polyp   . Allergic rhinitis   . Aortic dilatation (HCC)   . Bicuspid aortic valve   . Hypertension     Past Surgical History:  Procedure Laterality Date  . COLONOSCOPY W/ POLYPECTOMY    . TONSILLECTOMY    . UMBILICAL HERNIA REPAIR    . VASECTOMY      Current Medications: Outpatient Medications Prior to Visit  Medication Sig Dispense Refill  . amLODipine (NORVASC) 5 MG tablet Take 1 tablet (5 mg total) by mouth daily. *Please keep 05/10/16 appointment for further refills* 15 tablet 0  . aspirin 81 MG tablet Take 81 mg by mouth daily.    Marland Kitchen esomeprazole (NEXIUM) 40 MG capsule Take 40 mg by mouth daily.    Marland Kitchen loratadine (CLARITIN) 10 MG tablet Take 10 mg by mouth daily.    Marland Kitchen losartan (COZAAR) 100 MG tablet Take 100 mg by mouth daily.  0  . Multiple Vitamin (MULTIVITAMIN) capsule Take 1 capsule by mouth daily.    . Multiple Vitamins-Minerals (EYE VITAMINS PO) Take by mouth.    . Tamsulosin HCl (FLOMAX) 0.4 MG CAPS Take 0.4 mg by mouth daily.     No facility-administered medications prior to visit.      Allergies:   Amlodipine and Amoxicillin   Social History   Social History  . Marital status: Married    Spouse name: N/A  . Number of  children: N/A  . Years of education: N/A   Occupational History  . mail clerk    Social History Main Topics  . Smoking status: Former Smoker    Packs/day: 1.00    Years: 10.00    Types: Cigarettes    Quit date: 04/18/1968  . Smokeless tobacco: Never Used  . Alcohol use 1.2 oz/week    2 Cans of beer per week  . Drug use: No  . Sexual activity: Not Asked   Other Topics Concern  . None   Social History Narrative  . None     Family History:  The patient's family history includes Breast cancer in his sister; Heart attack in his father; Heart failure in his father; Uterine cancer in his sister.   ROS:   Please see the history of present illness.    ROS All other systems reviewed and are negative.  No flowsheet data found.     PHYSICAL EXAM:   VS:  BP 118/72   Pulse (!) 59   Ht 6\' 1"  (1.854 m)   Wt 232 lb 9.6 oz (105.5 kg)   BMI 30.69 kg/m    GEN: Well nourished, well developed, in no acute distress  HEENT: normal  Neck:  no JVD, carotid bruits, or masses Cardiac: RRR; no murmurs, rubs, or gallops,no edema.  Intact distal pulses bilaterally.  Respiratory:  clear to auscultation bilaterally, normal work of breathing GI: soft, nontender, nondistended, + BS MS: no deformity or atrophy  Skin: warm and dry, no rash Neuro:  Alert and Oriented x 3, Strength and sensation are intact Psych: euthymic mood, full affect  Wt Readings from Last 3 Encounters:  05/10/16 232 lb 9.6 oz (105.5 kg)  01/19/15 237 lb (107.5 kg)  10/17/14 237 lb (107.5 kg)      Studies/Labs Reviewed:   EKG:  EKG is ordered today.  The ekg ordered today demonstrates sinus bradycardia at 59bpm with first degree AV block and no ST changes  Recent Labs: No results found for requested labs within last 8760 hours.   Lipid Panel No results found for: CHOL, TRIG, HDL, CHOLHDL, VLDL, LDLCALC, LDLDIRECT  Additional studies/ records that were reviewed today include:  none    ASSESSMENT:    1.  Bicuspid aortic valve   2. Dilated aortic root ([place])   3. Essential hypertension, benign      PLAN:  In order of problems listed above:  1. Bicuspid AV with no AS but trivial AR on echo 2016. Repeat echo. 2. Dilated aortic root and ascending aorta  (32mm and 12mm respectively by echo 2016).  I will repeat echo to check for progression.  He needs aggressive BP control. I will check his FLP from PCP office. 3. HTN - BP controlled on current meds.  Continue amlodipine/ARB.  Check BMET from PCP    Medication Adjustments/Labs and Tests Ordered: Current medicines are reviewed at length with the patient today.  Concerns regarding medicines are outlined above.  Medication changes, Labs and Tests ordered today are listed in the Patient Instructions below.  There are no Patient Instructions on file for this visit.   Signed, Fransico Him, MD  05/10/2016 11:05 AM    [facility] [address], [place], [place]  [address] Phone: [phone]; Fax: [phone]

## 2016-05-12 ENCOUNTER — Other Ambulatory Visit: Payer: Self-pay | Admitting: Cardiology

## 2016-05-19 ENCOUNTER — Other Ambulatory Visit: Payer: Self-pay | Admitting: *Deleted

## 2016-05-19 DIAGNOSIS — E7800 Pure hypercholesterolemia, unspecified: Secondary | ICD-10-CM

## 2016-05-19 MED ORDER — ATORVASTATIN CALCIUM 10 MG PO TABS: 10.0000 mg | ORAL_TABLET | Freq: Every day | ORAL | 11 refills | Status: DC

## 2016-05-24 ENCOUNTER — Ambulatory Visit (HOSPITAL_COMMUNITY): Payer: Medicare Other | Attending: Cardiology

## 2016-05-24 ENCOUNTER — Other Ambulatory Visit: Payer: Self-pay

## 2016-05-24 DIAGNOSIS — I10 Essential (primary) hypertension: Secondary | ICD-10-CM | POA: Diagnosis not present

## 2016-05-24 DIAGNOSIS — Z87891 Personal history of nicotine dependence: Secondary | ICD-10-CM | POA: Insufficient documentation

## 2016-05-24 DIAGNOSIS — Q231 Congenital insufficiency of aortic valve: Secondary | ICD-10-CM | POA: Diagnosis not present

## 2016-05-24 DIAGNOSIS — I77810 Thoracic aortic ectasia: Secondary | ICD-10-CM | POA: Insufficient documentation

## 2016-05-24 DIAGNOSIS — Z6830 Body mass index (BMI) 30.0-30.9, adult: Secondary | ICD-10-CM | POA: Diagnosis not present

## 2016-05-24 LAB — ECHOCARDIOGRAM COMPLETE
AOASC: 38 cm
CHL CUP DOP CALC LVOT VTI: 26.8 cm
CHL CUP MV DEC (S): 327
CHL CUP TV REG PEAK VELOCITY: 210 cm/s
E/e' ratio: 8.26
EWDT: 327 ms
FS: 36 % (ref 28–44)
IVS/LV PW RATIO, ED: 0.96
LA ID, A-P, ES: 41 mm
LA diam end sys: 41 mm
LA diam index: 1.79 cm/m2
LA vol A4C: 53.4 ml
LA vol index: 26.3 mL/m2
LA vol: 60.2 mL
LDCA: 4.52 cm2
LV E/e' medial: 8.26
LV E/e'average: 8.26
LV PW d: 10.1 mm — AB (ref 0.6–1.1)
LV TDI E'LATERAL: 6.85
LV TDI E'MEDIAL: 5.98
LV e' LATERAL: 6.85 cm/s
LVOT SV: 121 mL
LVOT diameter: 24 mm
LVOTPV: 99.2 cm/s
Lateral S' vel: 14.7 cm/s
MV pk A vel: 39.8 m/s
MV pk E vel: 56.6 m/s
P 1/2 time: 1174 ms
TAPSE: 28.1 mm
TR max vel: 210 cm/s

## 2016-05-25 DIAGNOSIS — R972 Elevated prostate specific antigen [PSA]: Secondary | ICD-10-CM | POA: Diagnosis not present

## 2016-05-25 DIAGNOSIS — N400 Enlarged prostate without lower urinary tract symptoms: Secondary | ICD-10-CM | POA: Diagnosis not present

## 2016-05-26 ENCOUNTER — Telehealth: Payer: Self-pay | Admitting: Cardiology

## 2016-05-26 DIAGNOSIS — I77810 Thoracic aortic ectasia: Secondary | ICD-10-CM

## 2016-05-26 DIAGNOSIS — Q231 Congenital insufficiency of aortic valve: Secondary | ICD-10-CM

## 2016-05-26 NOTE — Telephone Encounter (Signed)
Follow Up   Pt returning phone call from [facility]

## 2016-05-26 NOTE — Telephone Encounter (Signed)
-----   Message from Sueanne Margarita, MD sent at 05/25/2016  4:19 PM EST ----- Echo showed normal LVF with increased stiffness of heart muscle, mild AI, moderately dilated aortic root and ascending aorta - repeat echo in 1 year for dilated aortic root

## 2016-05-26 NOTE — Telephone Encounter (Signed)
Informed patient of results and verbal understanding expressed.  Repeat ECHO ordered to be scheduled in 1 year. Patient agrees with treatment plan. 

## 2016-06-27 ENCOUNTER — Other Ambulatory Visit: Payer: Medicare Other | Admitting: *Deleted

## 2016-06-27 DIAGNOSIS — E7800 Pure hypercholesterolemia, unspecified: Secondary | ICD-10-CM

## 2016-06-27 LAB — HEPATIC FUNCTION PANEL
ALK PHOS: 62 IU/L (ref 39–117)
ALT: 18 IU/L (ref 0–44)
AST: 15 IU/L (ref 0–40)
Albumin: 4.4 g/dL (ref 3.5–4.8)
BILIRUBIN, DIRECT: 0.37 mg/dL (ref 0.00–0.40)
Bilirubin Total: 1.3 mg/dL — ABNORMAL HIGH (ref 0.0–1.2)
Total Protein: 6.4 g/dL (ref 6.0–8.5)

## 2016-06-27 LAB — LIPID PANEL
CHOLESTEROL TOTAL: 140 mg/dL (ref 100–199)
Chol/HDL Ratio: 1.8 ratio units (ref 0.0–5.0)
HDL: 79 mg/dL (ref >39)
LDL Calculated: 45 mg/dL (ref 0–99)
TRIGLYCERIDES: 82 mg/dL (ref 0–149)
VLDL Cholesterol Cal: 16 mg/dL (ref 5–40)

## 2016-07-04 DIAGNOSIS — H52203 Unspecified astigmatism, bilateral: Secondary | ICD-10-CM | POA: Diagnosis not present

## 2016-07-04 DIAGNOSIS — H26493 Other secondary cataract, bilateral: Secondary | ICD-10-CM | POA: Diagnosis not present

## 2016-07-04 DIAGNOSIS — H43813 Vitreous degeneration, bilateral: Secondary | ICD-10-CM | POA: Diagnosis not present

## 2016-07-04 DIAGNOSIS — H353132 Nonexudative age-related macular degeneration, bilateral, intermediate dry stage: Secondary | ICD-10-CM | POA: Diagnosis not present

## 2016-08-04 DIAGNOSIS — H26491 Other secondary cataract, right eye: Secondary | ICD-10-CM | POA: Diagnosis not present

## 2016-08-25 DIAGNOSIS — R972 Elevated prostate specific antigen [PSA]: Secondary | ICD-10-CM | POA: Diagnosis not present

## 2016-11-24 DIAGNOSIS — R972 Elevated prostate specific antigen [PSA]: Secondary | ICD-10-CM | POA: Diagnosis not present

## 2017-02-15 DIAGNOSIS — Z23 Encounter for immunization: Secondary | ICD-10-CM | POA: Diagnosis not present

## 2017-05-04 ENCOUNTER — Other Ambulatory Visit: Payer: Self-pay | Admitting: Cardiology

## 2017-05-04 DIAGNOSIS — E7800 Pure hypercholesterolemia, unspecified: Secondary | ICD-10-CM

## 2017-05-16 DIAGNOSIS — E785 Hyperlipidemia, unspecified: Secondary | ICD-10-CM | POA: Insufficient documentation

## 2017-05-16 NOTE — Progress Notes (Signed)
Cardiology Office Note:    Date:  05/18/2017   ID:  Stephen [name], DOB February 05, 1941, MRN [id number]  PCP:  Hulan Fess, MD  Cardiologist:  No primary care provider on file.    Referring MD: Hulan Fess, MD   Chief Complaint  Patient presents with  . Follow-up    BAV, HTN, dilated aorta    History of Present Illness:    Stephen [name] is a 77 y.o. male with a hx of HTN, bicuspid AV and dilated aorta.  He  is here today for followup and is doing well.  He denies any chest pain or pressure, SOB, DOE, PND, orthopnea, LE edema, dizziness or syncope. He is compliant with his meds and is tolerating meds with no SE.    Past Medical History:  Diagnosis Date  . Adenomatous colon polyp   . Allergic rhinitis   . Aortic dilatation (HCC)   . Bicuspid aortic valve   . Hypertension     Past Surgical History:  Procedure Laterality Date  . COLONOSCOPY W/ POLYPECTOMY    . TONSILLECTOMY    . UMBILICAL HERNIA REPAIR    . VASECTOMY      Current Medications: Current Meds  Medication Sig  . amLODipine (NORVASC) 5 MG tablet TAKE 1 TABLET BY MOUTH DAILY  . aspirin 81 MG tablet Take 81 mg by mouth daily.  Marland Kitchen atorvastatin (LIPITOR) 10 MG tablet TAKE 1 TABLET(10 MG) BY MOUTH DAILY  . esomeprazole (NEXIUM) 40 MG capsule Take 40 mg by mouth daily.  Marland Kitchen loratadine (CLARITIN) 10 MG tablet Take 10 mg by mouth daily.  Marland Kitchen losartan (COZAAR) 100 MG tablet Take 100 mg by mouth daily.  . Multiple Vitamin (MULTIVITAMIN) capsule Take 1 capsule by mouth daily.  . Multiple Vitamins-Minerals (EYE VITAMINS PO) Take by mouth.  . Tamsulosin HCl (FLOMAX) 0.4 MG CAPS Take 0.4 mg by mouth daily.     Allergies:   Amlodipine and Amoxicillin   Social History   Socioeconomic History  . Marital status: Married    Spouse name: None  . Number of children: None  . Years of education: None  . Highest education level: None  Social Needs  . Financial resource strain: None  . Food insecurity - worry: None  . Food  insecurity - inability: None  . Transportation needs - medical: None  . Transportation needs - non-medical: None  Occupational History  . Occupation: Control and instrumentation engineer  Tobacco Use  . Smoking status: Former Smoker    Packs/day: 1.00    Years: 10.00    Pack years: 10.00    Types: Cigarettes    Last attempt to quit: 04/18/1968    Years since quitting: 49.1  . Smokeless tobacco: Never Used  Substance and Sexual Activity  . Alcohol use: Yes    Alcohol/week: 1.2 oz    Types: 2 Cans of beer per week  . Drug use: No  . Sexual activity: None  Other Topics Concern  . None  Social History Narrative  . None     Family History: The patient's family history includes Breast cancer in his sister; Heart attack in his father; Heart failure in his father; Uterine cancer in his sister.  ROS:   Please see the history of present illness.    ROS  All other systems reviewed and negative.   EKGs/Labs/Other Studies Reviewed:    The following studies were reviewed today: none  EKG:  EKG is  ordered today.  The ekg ordered today demonstrates Sinus  bradycardia at 57bpm with no ST changes  Recent Labs: 06/27/2016: ALT 18   Recent Lipid Panel    Component Value Date/Time   CHOL 140 06/27/2016 1015   TRIG 82 06/27/2016 1015   HDL 79 06/27/2016 1015   CHOLHDL 1.8 06/27/2016 1015   LDLCALC 45 06/27/2016 1015    Physical Exam:    VS:  BP 120/64   Pulse (!) 57   Ht 6\' 1"  (1.854 m)   Wt 235 lb 12.8 oz (107 kg)   SpO2 96%   BMI 31.11 kg/m     Wt Readings from Last 3 Encounters:  05/18/17 235 lb 12.8 oz (107 kg)  05/10/16 232 lb 9.6 oz (105.5 kg)  01/19/15 237 lb (107.5 kg)     GEN:  Well nourished, well developed in no acute distress HEENT: Normal NECK: No JVD; No carotid bruits LYMPHATICS: No lymphadenopathy CARDIAC: RRR, no murmurs, rubs, gallops RESPIRATORY:  Clear to auscultation without rales, wheezing or rhonchi  ABDOMEN: Soft, non-tender, non-distended MUSCULOSKELETAL:  No edema;  No deformity  SKIN: Warm and dry NEUROLOGIC:  Alert and oriented x 3 PSYCHIATRIC:  Normal affect   ASSESSMENT:    1. Bicuspid aortic valve   2. Essential hypertension, benign   3. Dilated aortic root ([place])   4. Hyperlipidemia LDL goal <70    PLAN:    In order of problems listed above: Repeat  1.  Bicuspid AV - last echo appeared trileaflet with mild AR and mildly calcified leaflets.  Repeat echo pending.    2.  HTN - BP is well controlled on current meds. He will continue on amlodipine 5mg  daily and Losartan 100mg  daily.  I will get a BMET.    3.  Dilated aortic root - 48mm aortic root and 60mm ascending aorta by echo 05/2016.  He has repeat echo pending. He will continue on statin   4.  Hyperlipidemia with LDL goal < 70.  He will continue on Atorvastatin 10mg  daily.  I will repeat FLP and ALT.   Medication Adjustments/Labs and Tests Ordered: Current medicines are reviewed at length with the patient today.  Concerns regarding medicines are outlined above.  Orders Placed This Encounter  Procedures  . EKG 12-Lead   No orders of the defined types were placed in this encounter.   Signed, Fransico Him, MD  05/18/2017 8:22 AM    [facility]

## 2017-05-18 ENCOUNTER — Encounter: Payer: Self-pay | Admitting: Cardiology

## 2017-05-18 ENCOUNTER — Ambulatory Visit (INDEPENDENT_AMBULATORY_CARE_PROVIDER_SITE_OTHER): Payer: Medicare Other | Admitting: Cardiology

## 2017-05-18 VITALS — BP 120/64 | HR 57 | Ht 73.0 in | Wt 235.8 lb

## 2017-05-18 DIAGNOSIS — I10 Essential (primary) hypertension: Secondary | ICD-10-CM | POA: Diagnosis not present

## 2017-05-18 DIAGNOSIS — I77810 Thoracic aortic ectasia: Secondary | ICD-10-CM

## 2017-05-18 DIAGNOSIS — E785 Hyperlipidemia, unspecified: Secondary | ICD-10-CM | POA: Diagnosis not present

## 2017-05-18 DIAGNOSIS — Q231 Congenital insufficiency of aortic valve: Secondary | ICD-10-CM | POA: Diagnosis not present

## 2017-05-18 DIAGNOSIS — Q2381 Bicuspid aortic valve: Secondary | ICD-10-CM

## 2017-05-18 LAB — HEPATIC FUNCTION PANEL
ALK PHOS: 60 IU/L (ref 39–117)
ALT: 17 IU/L (ref 0–44)
AST: 21 IU/L (ref 0–40)
Albumin: 4.2 g/dL (ref 3.5–4.8)
BILIRUBIN, DIRECT: 0.24 mg/dL (ref 0.00–0.40)
Bilirubin Total: 0.8 mg/dL (ref 0.0–1.2)
Total Protein: 6.4 g/dL (ref 6.0–8.5)

## 2017-05-18 LAB — LIPID PANEL
CHOLESTEROL TOTAL: 142 mg/dL (ref 100–199)
Chol/HDL Ratio: 1.7 ratio (ref 0.0–5.0)
HDL: 82 mg/dL (ref >39)
LDL Calculated: 50 mg/dL (ref 0–99)
Triglycerides: 51 mg/dL (ref 0–149)
VLDL Cholesterol Cal: 10 mg/dL (ref 5–40)

## 2017-05-18 LAB — BASIC METABOLIC PANEL
BUN / CREAT RATIO: 13 (ref 10–24)
BUN: 9 mg/dL (ref 8–27)
CO2: 25 mmol/L (ref 20–29)
CREATININE: 0.69 mg/dL — AB (ref 0.76–1.27)
Calcium: 9 mg/dL (ref 8.6–10.2)
Chloride: 103 mmol/L (ref 96–106)
GFR calc non Af Amer: 93 mL/min/{1.73_m2} (ref >59)
GFR, EST AFRICAN AMERICAN: 107 mL/min/{1.73_m2} (ref >59)
Glucose: 91 mg/dL (ref 65–99)
Potassium: 4.5 mmol/L (ref 3.5–5.2)
Sodium: 141 mmol/L (ref 134–144)

## 2017-05-18 NOTE — Patient Instructions (Signed)
Medication Instructions:  Your physician recommends that you continue on your current medications as directed. Please refer to the Current Medication list given to you today.  Labwork: Today for kidney function, liver function, and fasting lipids  Testing/Procedures: None ordered   Follow-Up: Your physician wants you to follow-up in: 1 year with Dr. Radford Pax. You will receive a reminder letter in the mail two months in advance. If you don't receive a letter, please call our office to schedule the follow-up appointment.  Any Other Special Instructions Will Be Listed Below (If Applicable).     If you need a refill on your cardiac medications before your next appointment, please call your pharmacy.

## 2017-05-25 ENCOUNTER — Telehealth: Payer: Self-pay

## 2017-05-25 ENCOUNTER — Encounter: Payer: Self-pay | Admitting: Cardiology

## 2017-05-25 ENCOUNTER — Ambulatory Visit (HOSPITAL_COMMUNITY): Payer: Medicare Other | Attending: Cardiovascular Disease

## 2017-05-25 ENCOUNTER — Other Ambulatory Visit: Payer: Self-pay

## 2017-05-25 DIAGNOSIS — I351 Nonrheumatic aortic (valve) insufficiency: Secondary | ICD-10-CM | POA: Insufficient documentation

## 2017-05-25 DIAGNOSIS — I77810 Thoracic aortic ectasia: Secondary | ICD-10-CM | POA: Diagnosis not present

## 2017-05-25 DIAGNOSIS — I10 Essential (primary) hypertension: Secondary | ICD-10-CM | POA: Insufficient documentation

## 2017-05-25 LAB — ECHOCARDIOGRAM COMPLETE
AVPHT: 813 ms
CHL CUP MV DEC (S): 239
E decel time: 239 msec
E/e' ratio: 7.23
FS: 39 % (ref 28–44)
IVS/LV PW RATIO, ED: 1.05
LA ID, A-P, ES: 47 mm
LA diam end sys: 47 mm
LA vol A4C: 71 ml
LADIAMINDEX: 2.03 cm/m2
LV E/e' medial: 7.23
LV E/e'average: 7.23
LV TDI E'LATERAL: 8.33
LV TDI E'MEDIAL: 5.04
LVELAT: 8.33 cm/s
LVOT area: 3.46 cm2
LVOT diameter: 21 mm
MV pk E vel: 60.2 m/s
MVPKAVEL: 74 m/s
PW: 12.3 mm — AB (ref 0.6–1.1)

## 2017-05-25 NOTE — Telephone Encounter (Signed)
Notes recorded by Teressa Senter, RN on 05/25/2017 at 4:38 PM EST Patient made aware of echo results and Dr. Radford Pax recommendation for chest CT/abd CT and a repeat echo in 1 year.  Patient in agreement with plan and thanked me for the call Orders placed to be scheduled.  Notes recorded by Sueanne Margarita, MD on 05/25/2017 at 4:14 PM EST Echo showed normal LVF with mild LVH, mild AI, moderately dilated ascending aorta at 4.4cm - no significant change from a year ago Please get a chest and abdominal CT angio to assess entire aorta and repeat limited echo for aortic root in 1 year

## 2017-06-01 DIAGNOSIS — Z0000 Encounter for general adult medical examination without abnormal findings: Secondary | ICD-10-CM | POA: Diagnosis not present

## 2017-06-01 DIAGNOSIS — H6120 Impacted cerumen, unspecified ear: Secondary | ICD-10-CM | POA: Diagnosis not present

## 2017-06-01 DIAGNOSIS — I77810 Thoracic aortic ectasia: Secondary | ICD-10-CM | POA: Diagnosis not present

## 2017-06-01 DIAGNOSIS — Z86010 Personal history of colonic polyps: Secondary | ICD-10-CM | POA: Diagnosis not present

## 2017-06-01 DIAGNOSIS — I10 Essential (primary) hypertension: Secondary | ICD-10-CM | POA: Diagnosis not present

## 2017-06-01 DIAGNOSIS — R972 Elevated prostate specific antigen [PSA]: Secondary | ICD-10-CM | POA: Diagnosis not present

## 2017-06-06 ENCOUNTER — Other Ambulatory Visit: Payer: Self-pay | Admitting: Cardiology

## 2017-06-06 DIAGNOSIS — E7800 Pure hypercholesterolemia, unspecified: Secondary | ICD-10-CM

## 2017-06-07 ENCOUNTER — Ambulatory Visit (INDEPENDENT_AMBULATORY_CARE_PROVIDER_SITE_OTHER)
Admission: RE | Admit: 2017-06-07 | Discharge: 2017-06-07 | Disposition: A | Discharge status: Home / Self Care | Payer: Medicare Other | Source: Ambulatory Visit | Attending: Cardiology | Admitting: Cardiology

## 2017-06-07 DIAGNOSIS — I700 Atherosclerosis of aorta: Secondary | ICD-10-CM | POA: Diagnosis not present

## 2017-06-07 DIAGNOSIS — I77810 Thoracic aortic ectasia: Secondary | ICD-10-CM

## 2017-06-07 DIAGNOSIS — I712 Thoracic aortic aneurysm, without rupture: Secondary | ICD-10-CM | POA: Diagnosis not present

## 2017-06-07 MED ORDER — IOPAMIDOL (ISOVUE-370) INJECTION 76%
100.0000 mL | Freq: Once | INTRAVENOUS | Status: AC | PRN
  Administered 2017-06-07: 100 mL via INTRAVENOUS

## 2017-06-08 ENCOUNTER — Telehealth: Payer: Self-pay

## 2017-06-08 DIAGNOSIS — I77810 Thoracic aortic ectasia: Secondary | ICD-10-CM

## 2017-06-08 DIAGNOSIS — I719 Aortic aneurysm of unspecified site, without rupture: Secondary | ICD-10-CM

## 2017-06-08 NOTE — Telephone Encounter (Signed)
Notes recorded by Teressa Senter, RN on 06/08/2017 at 5:12 PM EST Patient made aware of CT results. Patient verbalized understanding and thankful for the call. Echo order to be scheduled in a year.  Notes recorded by Sueanne Margarita, MD on 06/07/2017 at 7:44 PM EST 27mm ascending aortic aneursym but normal abdominal aorta - repeat 2D echo in 1 year

## 2017-07-03 DIAGNOSIS — H52203 Unspecified astigmatism, bilateral: Secondary | ICD-10-CM | POA: Diagnosis not present

## 2017-07-03 DIAGNOSIS — H26492 Other secondary cataract, left eye: Secondary | ICD-10-CM | POA: Diagnosis not present

## 2017-07-03 DIAGNOSIS — H43813 Vitreous degeneration, bilateral: Secondary | ICD-10-CM | POA: Diagnosis not present

## 2017-07-03 DIAGNOSIS — H353132 Nonexudative age-related macular degeneration, bilateral, intermediate dry stage: Secondary | ICD-10-CM | POA: Diagnosis not present

## 2018-01-24 DIAGNOSIS — Z23 Encounter for immunization: Secondary | ICD-10-CM | POA: Diagnosis not present

## 2018-04-30 DIAGNOSIS — I719 Aortic aneurysm of unspecified site, without rupture: Secondary | ICD-10-CM | POA: Insufficient documentation

## 2018-04-30 NOTE — Progress Notes (Signed)
Cardiology Office Note    Date:  05/01/2018   ID:  Stephen [name], DOB 01-28-1941, MRN [id number]  PCP:  Hulan Fess, MD  Cardiologist: Fransico Him, MD EPS: None  No chief complaint on file.   History of Present Illness:  Stephen [name] is a 78 y.o. male with history of bicuspid aortic valve and moderately dilated ascending aorta at 4.1 cm on CT 05/2017.  2D echo 05/2017 actually showed aortic valve to be trileaflet with mild AI, with 4.4 cm ascending aortic aneurysm, normal LV function grade 1 DD.  Patient here for yearly f/u. Takes care of his 2 grandchildren ages 15 & 2 to keep busy. No regular exercise. No changes in health. Denies chest pain, dyspnea, dizziness or presyncope.  Has chronic leg swelling on amlodipine that is unchanged.  He says he does crave salt and adds it to many of his foods.   Past Medical History:  Diagnosis Date  . Adenomatous colon polyp   . Allergic rhinitis   . Aortic dilatation (HCC)    19mm by echo 05/2017 - 3mm by Chest CT  . Bicuspid aortic valve   . Hypertension     Past Surgical History:  Procedure Laterality Date  . COLONOSCOPY W/ POLYPECTOMY    . TONSILLECTOMY    . UMBILICAL HERNIA REPAIR    . VASECTOMY      Current Medications: Current Meds  Medication Sig  . amLODipine (NORVASC) 5 MG tablet TAKE 1 TABLET BY MOUTH DAILY  . aspirin 81 MG tablet Take 81 mg by mouth daily.  Marland Kitchen atorvastatin (LIPITOR) 10 MG tablet TAKE 1 TABLET(10 MG) BY MOUTH DAILY  . esomeprazole (NEXIUM) 40 MG capsule Take 40 mg by mouth daily.  Marland Kitchen loratadine (CLARITIN) 10 MG tablet Take 10 mg by mouth daily.  Marland Kitchen losartan (COZAAR) 100 MG tablet Take 100 mg by mouth daily.  . Multiple Vitamin (MULTIVITAMIN) capsule Take 1 capsule by mouth daily.  . Multiple Vitamins-Minerals (EYE VITAMINS PO) Take by mouth.  . Tamsulosin HCl (FLOMAX) 0.4 MG CAPS Take 0.4 mg by mouth daily.     Allergies:   Amlodipine and Amoxicillin   Social History   Socioeconomic History  .  Marital status: Married    Spouse name: Not on file  . Number of children: Not on file  . Years of education: Not on file  . Highest education level: Not on file  Occupational History  . Occupation: Control and instrumentation engineer  Social Needs  . Financial resource strain: Not on file  . Food insecurity:    Worry: Not on file    Inability: Not on file  . Transportation needs:    Medical: Not on file    Non-medical: Not on file  Tobacco Use  . Smoking status: Former Smoker    Packs/day: 1.00    Years: 10.00    Pack years: 10.00    Types: Cigarettes    Last attempt to quit: 04/18/1968    Years since quitting: 50.0  . Smokeless tobacco: Never Used  Substance and Sexual Activity  . Alcohol use: Yes    Alcohol/week: 2.0 standard drinks    Types: 2 Cans of beer per week  . Drug use: No  . Sexual activity: Not on file  Lifestyle  . Physical activity:    Days per week: Not on file    Minutes per session: Not on file  . Stress: Not on file  Relationships  . Social connections:    Talks on  phone: Not on file    Gets together: Not on file    Attends religious service: Not on file    Active member of club or organization: Not on file    Attends meetings of clubs or organizations: Not on file    Relationship status: Not on file  Other Topics Concern  . Not on file  Social History Narrative  . Not on file     Family History:  The patient's family history includes Breast cancer in his sister; Heart attack in his father; Heart failure in his father; Uterine cancer in his sister.   ROS:   Please see the history of present illness.    Review of Systems  Constitution: Negative.  HENT: Negative.   Cardiovascular: Positive for leg swelling.  Respiratory: Negative.   Endocrine: Negative.   Hematologic/Lymphatic: Negative.   Musculoskeletal: Negative.   Gastrointestinal: Negative.   Genitourinary: Negative.   Neurological: Negative.    All other systems reviewed and are negative.   PHYSICAL  EXAM:   VS:  BP 120/76   Pulse (!) 56   Ht 6\' 1"  (1.854 m)   Wt 233 lb 1.9 oz (105.7 kg)   SpO2 96%   BMI 30.76 kg/m   Physical Exam  GEN: Well nourished, well developed, in no acute distress  Neck: no JVD, carotid bruits, or masses Cardiac:RRR; no murmurs, rubs, or gallops  Respiratory:  clear to auscultation bilaterally, normal work of breathing GI: soft, nontender, nondistended, + BS Ext: +1 edema bilaterally without cyanosis, clubbing,  Good distal pulses bilaterally Neuro:  Alert and Oriented x 3 Psych: euthymic mood, full affect  Wt Readings from Last 3 Encounters:  05/01/18 233 lb 1.9 oz (105.7 kg)  05/18/17 235 lb 12.8 oz (107 kg)  05/10/16 232 lb 9.6 oz (105.5 kg)      Studies/Labs Reviewed:   EKG:  EKG is ordered today.  The ekg ordered today demonstrates sinus bradycardia at 56 bpm with first-degree AV block  Recent Labs: 05/18/2017: ALT 17; BUN 9; Creatinine, Ser 0.69; Potassium 4.5; Sodium 141   Lipid Panel    Component Value Date/Time   CHOL 142 05/18/2017 0841   TRIG 51 05/18/2017 0841   HDL 82 05/18/2017 0841   CHOLHDL 1.7 05/18/2017 0841   LDLCALC 50 05/18/2017 0841    Additional studies/ records that were reviewed today include:  CT Angio of the chest and abdomen 06/07/2017 1. Mild fusiform aneurysmal dilatation of the ascending thoracic aorta measuring 41 mm in diameter. Recommend annual imaging followup by CTA or MRA. This recommendation follows 2010 ACCF/AHA/AATS/ACR/ASA/SCA/SCAI/SIR/STS/SVM Guidelines for the Diagnosis and Management of Patients with Thoracic Aortic Disease. Circulation. 2010; 121: M355-H741. 2. Aortic aneurysm NOS (ICD10-I71.9).   Abdominal CTA impression:   1. Scattered atherosclerotic plaque within a normal caliber abdominal aorta, not resulting in hemodynamically significant stenosis. 2.  Aortic Atherosclerosis (ICD10-I70.0). 3. No acute findings within the abdomen.     2D echo 2/7/2019Study Conclusions   - Left  ventricle: The cavity size was normal. There was mild   concentric hypertrophy. Systolic function was normal. The   estimated ejection fraction was in the range of 60% to 65%. Wall   motion was normal; there were no regional wall motion   abnormalities. Doppler parameters are consistent with abnormal   left ventricular relaxation (grade 1 diastolic dysfunction).   Doppler parameters are consistent with indeterminate ventricular   filling pressure. - Aortic valve: Transvalvular velocity was within the normal range.  There was no stenosis. There was mild regurgitation. - Aorta: Ascending aortic AP diameter: 4.4 mm. - Ascending aorta: The ascending aorta was mildly dilated. - Mitral valve: Transvalvular velocity was within the normal range.   There was no evidence for stenosis. There was no regurgitation. - Left atrium: The atrium was mildly dilated. - Right ventricle: The cavity size was normal. Wall thickness was   normal. Systolic function was normal. - Atrial septum: No defect or patent foramen ovale was identified   by color flow Doppler. - Tricuspid valve: There was trivial regurgitation.      ASSESSMENT:    1. Aortic aneurysm without rupture, unspecified portion of aorta ([place])   2. Dilated aortic root ([place])   3. Essential hypertension, benign   4. Hyperlipidemia LDL goal <70   5. Morbid obesity ([place])      PLAN:  In order of problems listed above:  Ascending aortic aneurysm 4.4 cm on echo 4.1 on CT 05/2017.  Dr. Radford Pax recommends repeat CTA of the chest and abdomen to assess the entire aorta and limited echo for aortic root.  Will order.  Dilated aortic root was normal in size on last year's echo for limited echo  Essential hypertension blood pressure well controlled but does have leg edema.  Recommend 2 g sodium diet.  Hyperlipidemia on Lipitor.  Check fasting lipid panel and other labs today.  Morbid obesity exercise 20 to 30 minutes daily  recommended.    Medication Adjustments/Labs and Tests Ordered: Current medicines are reviewed at length with the patient today.  Concerns regarding medicines are outlined above.  Medication changes, Labs and Tests ordered today are listed in the Patient Instructions below. There are no Patient Instructions on file for this visit.   Sumner Boast, PA-C  05/01/2018 8:28 AM    [facility] [place], [place], [place]  [address] Phone: [phone]; Fax: [phone]

## 2018-05-01 ENCOUNTER — Ambulatory Visit (INDEPENDENT_AMBULATORY_CARE_PROVIDER_SITE_OTHER): Payer: Medicare Other | Admitting: Physician Assistant

## 2018-05-01 ENCOUNTER — Encounter: Payer: Self-pay | Admitting: Physician Assistant

## 2018-05-01 ENCOUNTER — Encounter (INDEPENDENT_AMBULATORY_CARE_PROVIDER_SITE_OTHER): Payer: Self-pay

## 2018-05-01 VITALS — BP 120/76 | HR 56 | Ht 73.0 in | Wt 233.1 lb

## 2018-05-01 DIAGNOSIS — I10 Essential (primary) hypertension: Secondary | ICD-10-CM

## 2018-05-01 DIAGNOSIS — I719 Aortic aneurysm of unspecified site, without rupture: Secondary | ICD-10-CM | POA: Diagnosis not present

## 2018-05-01 DIAGNOSIS — I77810 Thoracic aortic ectasia: Secondary | ICD-10-CM | POA: Diagnosis not present

## 2018-05-01 DIAGNOSIS — E785 Hyperlipidemia, unspecified: Secondary | ICD-10-CM

## 2018-05-01 LAB — CBC
HEMATOCRIT: 44.1 % (ref 37.5–51.0)
HEMOGLOBIN: 15.1 g/dL (ref 13.0–17.7)
MCH: 30.6 pg (ref 26.6–33.0)
MCHC: 34.2 g/dL (ref 31.5–35.7)
MCV: 90 fL (ref 79–97)
Platelets: 226 10*3/uL (ref 150–450)
RBC: 4.93 x10E6/uL (ref 4.14–5.80)
RDW: 11.8 % (ref 11.6–15.4)
WBC: 6.4 10*3/uL (ref 3.4–10.8)

## 2018-05-01 LAB — COMPREHENSIVE METABOLIC PANEL
A/G RATIO: 1.8 (ref 1.2–2.2)
ALT: 24 IU/L (ref 0–44)
AST: 20 IU/L (ref 0–40)
Albumin: 4.2 g/dL (ref 3.5–4.8)
Alkaline Phosphatase: 61 IU/L (ref 39–117)
BILIRUBIN TOTAL: 1.4 mg/dL — AB (ref 0.0–1.2)
BUN/Creatinine Ratio: 14 (ref 10–24)
BUN: 10 mg/dL (ref 8–27)
CALCIUM: 9.1 mg/dL (ref 8.6–10.2)
CHLORIDE: 104 mmol/L (ref 96–106)
CO2: 23 mmol/L (ref 20–29)
Creatinine, Ser: 0.69 mg/dL — ABNORMAL LOW (ref 0.76–1.27)
GFR calc Af Amer: 107 mL/min/{1.73_m2} (ref >59)
GFR, EST NON AFRICAN AMERICAN: 92 mL/min/{1.73_m2} (ref >59)
GLOBULIN, TOTAL: 2.3 g/dL (ref 1.5–4.5)
Glucose: 90 mg/dL (ref 65–99)
POTASSIUM: 4.6 mmol/L (ref 3.5–5.2)
SODIUM: 141 mmol/L (ref 134–144)
Total Protein: 6.5 g/dL (ref 6.0–8.5)

## 2018-05-01 LAB — LIPID PANEL
CHOL/HDL RATIO: 1.9 ratio (ref 0.0–5.0)
Cholesterol, Total: 140 mg/dL (ref 100–199)
HDL: 72 mg/dL (ref >39)
LDL Calculated: 56 mg/dL (ref 0–99)
Triglycerides: 58 mg/dL (ref 0–149)
VLDL Cholesterol Cal: 12 mg/dL (ref 5–40)

## 2018-05-01 NOTE — Addendum Note (Signed)
Addended by: Mendel Ryder on: 05/01/2018 10:22 AM   Modules accepted: Orders

## 2018-05-01 NOTE — Patient Instructions (Signed)
Medication Instructions:  Your physician recommends that you continue on your current medications as directed. Please refer to the Current Medication list given to you today.  If you need a refill on your cardiac medications before your next appointment, please call your pharmacy.   Lab work: TODAY: CBC, LIPIDS, CMET  If you have labs (blood work) drawn today and your tests are completely normal, you will receive your results only by: Marland Kitchen MyChart Message (if you have MyChart) OR . A paper copy in the mail If you have any lab test that is abnormal or we need to change your treatment, we will call you to review the results.  Testing/Procedures: Your physician recommends that you have a CT Angio Chest to assess your Aorta.  Your physician recommends that you have a CT Angio of your Abdomen.  Your physician has requested that you have an Limited Echocardiogram. Echocardiography is a painless test that uses sound waves to create images of your heart. It provides your doctor with information about the size and shape of your heart and how well your heart's chambers and valves are working. This procedure takes approximately one hour. There are no restrictions for this procedure.    Follow-Up: At [facility], you and your health needs are our priority.  As part of our continuing mission to provide you with exceptional heart care, we have created designated Provider Care Teams.  These Care Teams include your primary Cardiologist (physician) and Advanced Practice Providers (APPs -  Physician Assistants and Nurse Practitioners) who all work together to provide you with the care you need, when you need it. . You will need a follow up appointment in 1 year.  Please call our office 2 months in advance to schedule this appointment.  You may see Fransico Him, MD or one of the following Advanced Practice Providers on your designated Care Team:   . Lyda Jester, PA-C . Dayna Dunn, PA-C . Ermalinda Barrios,  PA-C  Any Other Special Instructions Will Be Listed Below (If Applicable).  Two Gram Sodium Diet 2000 mg  What is Sodium? Sodium is a mineral found naturally in many foods. The most significant source of sodium in the diet is table salt, which is about 40% sodium.  Processed, convenience, and preserved foods also contain a large amount of sodium.  The body needs only 500 mg of sodium daily to function,  A normal diet provides more than enough sodium even if you do not use salt.  Why Limit Sodium? A build up of sodium in the body can cause thirst, increased blood pressure, shortness of breath, and water retention.  Decreasing sodium in the diet can reduce edema and risk of heart attack or stroke associated with high blood pressure.  Keep in mind that there are many other factors involved in these health problems.  Heredity, obesity, lack of exercise, cigarette smoking, stress and what you eat all play a role.  General Guidelines:  Do not add salt at the table or in cooking.  One teaspoon of salt contains over 2 grams of sodium.  Read food labels  Avoid processed and convenience foods  Ask your dietitian before eating any foods not dicussed in the menu planning guidelines  Consult your physician if you wish to use a salt substitute or a sodium containing medication such as antacids.  Limit milk and milk products to 16 oz (2 cups) per day.  Shopping Hints:  READ LABELS!! "Dietetic" does not necessarily mean low sodium.  Salt and other sodium ingredients are often added to foods during processing.   Menu Planning Guidelines Food Group Choose More Often Avoid  Beverages (see also the milk group All fruit juices, low-sodium, salt-free vegetables juices, low-sodium carbonated beverages Regular vegetable or tomato juices, commercially softened water used for drinking or cooking  Breads and Cereals Enriched white, wheat, rye and pumpernickel bread, hard rolls and dinner rolls; muffins,  cornbread and waffles; most dry cereals, cooked cereal without added salt; unsalted crackers and breadsticks; low sodium or homemade bread crumbs Bread, rolls and crackers with salted tops; quick breads; instant hot cereals; pancakes; commercial bread stuffing; self-rising flower and biscuit mixes; regular bread crumbs or cracker crumbs  Desserts and Sweets Desserts and sweets mad with mild should be within allowance Instant pudding mixes and cake mixes  Fats Butter or margarine; vegetable oils; unsalted salad dressings, regular salad dressings limited to 1 Tbs; light, sour and heavy cream Regular salad dressings containing bacon fat, bacon bits, and salt pork; snack dips made with instant soup mixes or processed cheese; salted nuts  Fruits Most fresh, frozen and canned fruits Fruits processed with salt or sodium-containing ingredient (some dried fruits are processed with sodium sulfites        Vegetables Fresh, frozen vegetables and low- sodium canned vegetables Regular canned vegetables, sauerkraut, pickled vegetables, and others prepared in brine; frozen vegetables in sauces; vegetables seasoned with ham, bacon or salt pork  Condiments, Sauces, Miscellaneous  Salt substitute with physician's approval; pepper, herbs, spices; vinegar, lemon or lime juice; hot pepper sauce; garlic powder, onion powder, low sodium soy sauce (1 Tbs.); low sodium condiments (ketchup, chili sauce, mustard) in limited amounts (1 tsp.) fresh ground horseradish; unsalted tortilla chips, pretzels, potato chips, popcorn, salsa (1/4 cup) Any seasoning made with salt including garlic salt, celery salt, onion salt, and seasoned salt; sea salt, rock salt, kosher salt; meat tenderizers; monosodium glutamate; mustard, regular soy sauce, barbecue, sauce, chili sauce, teriyaki sauce, steak sauce, Worcestershire sauce, and most flavored vinegars; canned gravy and mixes; regular condiments; salted snack foods, olives, picles, relish,  horseradish sauce, catsup   Food preparation: Try these seasonings Meats:    Pork Sage, onion Serve with applesauce  Chicken Poultry seasoning, thyme, parsley Serve with cranberry sauce  Lamb Curry powder, rosemary, garlic, thyme Serve with mint sauce or jelly  Veal Marjoram, basil Serve with current jelly, cranberry sauce  Beef Pepper, bay leaf Serve with dry mustard, unsalted chive butter  Fish Bay leaf, dill Serve with unsalted lemon butter, unsalted parsley butter  Vegetables:    Asparagus Lemon juice   Broccoli Lemon juice   Carrots Mustard dressing parsley, mint, nutmeg, glazed with unsalted butter and sugar   Green beans Marjoram, lemon juice, nutmeg,dill seed   Tomatoes Basil, marjoram, onion   Spice /blend for [employer]" 4 tsp ground thyme 1 tsp ground sage 3 tsp ground rosemary 4 tsp ground marjoram   Test your knowledge 1. A product that says "Salt Free" may still contain sodium. True or False 2. Garlic Powder and Hot Pepper Sauce an be used as alternative seasonings.True or False 3. Processed foods have more sodium than fresh foods.  True or False 4. Canned Vegetables have less sodium than froze True or False  WAYS TO DECREASE YOUR SODIUM INTAKE 1. Avoid the use of added salt in cooking and at the table.  Table salt (and other prepared seasonings which contain salt) is probably one of the greatest sources of sodium in the  diet.  Unsalted foods can gain flavor from the sweet, sour, and butter taste sensations of herbs and spices.  Instead of using salt for seasoning, try the following seasonings with the foods listed.  Remember: how you use them to enhance natural food flavors is limited only by your creativity... Allspice-Meat, fish, eggs, fruit, peas, red and yellow vegetables Almond Extract-Fruit baked goods Anise Seed-Sweet breads, fruit, carrots, beets, cottage cheese, cookies (tastes like licorice) Basil-Meat, fish, eggs, vegetables, rice, vegetables salads, soups,  sauces Bay Leaf-Meat, fish, stews, poultry Burnet-Salad, vegetables (cucumber-like flavor) Caraway Seed-Bread, cookies, cottage cheese, meat, vegetables, cheese, rice Cardamon-Baked goods, fruit, soups Celery Powder or seed-Salads, salad dressings, sauces, meatloaf, soup, bread.Do not use  celery salt Chervil-Meats, salads, fish, eggs, vegetables, cottage cheese (parsley-like flavor) Chili Power-Meatloaf, chicken cheese, corn, eggplant, egg dishes Chives-Salads cottage cheese, egg dishes, soups, vegetables, sauces Cilantro-Salsa, casseroles Cinnamon-Baked goods, fruit, pork, lamb, chicken, carrots Cloves-Fruit, baked goods, fish, pot roast, green beans, beets, carrots Coriander-Pastry, cookies, meat, salads, cheese (lemon-orange flavor) Cumin-Meatloaf, fish,cheese, eggs, cabbage,fruit pie (caraway flavor) [employer], fruit, eggs, fish, poultry, cottage cheese, vegetables Dill Seed-Meat, cottage cheese, poultry, vegetables, fish, salads, bread Fennel Seed-Bread, cookies, apples, pork, eggs, fish, beets, cabbage, cheese, Licorice-like flavor Garlic-(buds or powder) Salads, meat, poultry, fish, bread, butter, vegetables, potatoes.Do not  use garlic salt Ginger-Fruit, vegetables, baked goods, meat, fish, poultry Horseradish Root-Meet, vegetables, butter Lemon Juice or Extract-Vegetables, fruit, tea, baked goods, fish salads Mace-Baked goods fruit, vegetables, fish, poultry (taste like nutmeg) Maple Extract-Syrups Marjoram-Meat, chicken, fish, vegetables, breads, green salads (taste like Sage) Mint-Tea, lamb, sherbet, vegetables, desserts, carrots, cabbage Mustard, Dry or Seed-Cheese, eggs, meats, vegetables, poultry Nutmeg-Baked goods, fruit, chicken, eggs, vegetables, desserts Onion Powder-Meat, fish, poultry, vegetables, cheese, eggs, bread, rice salads (Do not use   Onion salt) Orange Extract-Desserts, baked goods Oregano-Pasta, eggs, cheese, onions, pork, lamb, fish, chicken,  vegetables, green salads Paprika-Meat, fish, poultry, eggs, cheese, vegetables Parsley Flakes-Butter, vegetables, meat fish, poultry, eggs, bread, salads (certain forms may   Contain sodium Pepper-Meat fish, poultry, vegetables, eggs Peppermint Extract-Desserts, baked goods Poppy Seed-Eggs, bread, cheese, fruit dressings, baked goods, noodles, vegetables, cottage  [employer], poultry, meat, fish, cauliflower, turnips,eggs bread Saffron-Rice, bread, veal, chicken, fish, eggs Sage-Meat, fish, poultry, onions, eggplant, tomateos, pork, stews Savory-Eggs, salads, poultry, meat, rice, vegetables, soups, pork Tarragon-Meat, poultry, fish, eggs, butter, vegetables (licorice-like flavor)  Thyme-Meat, poultry, fish, eggs, vegetables, (clover-like flavor), sauces, soups Tumeric-Salads, butter, eggs, fish, rice, vegetables (saffron-like flavor) Vanilla Extract-Baked goods, candy Vinegar-Salads, vegetables, meat marinades Walnut Extract-baked goods, candy  2. Choose your Foods Wisely   The following is a list of foods to avoid which are high in sodium:  Meats-Avoid all smoked, canned, salt cured, dried and kosher meat and fish as well as Anchovies   [employer] meats:Bologna, Liverwurst, Pastrami Canned meat or fish  Marinated herring Caviar    Pepperoni Corned Beef   Pizza Dried chipped beef  Salami Frozen breaded fish or meat Salt pork Frankfurters or hot dogs  Sardines Gefilte fish   Sausage Ham (boiled ham, Proscuitto Smoked butt    spiced ham)   Spam      TV Dinners Vegetables Canned vegetables (Regular) Relish Canned mushrooms  Sauerkraut Olives    Tomato juice Pickles  Bakery and Dessert Products Canned puddings  Cream pies Cheesecake   Decorated cakes Cookies  Beverages/Juices Tomato juice, regular  Gatorade   V-8 vegetable juice, regular  Breads and Cereals Biscuit mixes  Salted potato chips, corn chips,  pretzels Bread stuffing mixes  Salted crackers and rolls Pancake and waffle mixes Self-rising flour  Seasonings Accent    Meat sauces Barbecue sauce  Meat tenderizer Catsup    Monosodium glutamate (MSG) Celery salt   Onion salt Chili sauce   Prepared mustard Garlic salt   Salt, seasoned salt, sea salt Gravy mixes   Soy sauce Horseradish   Steak sauce Ketchup   Tartar sauce Lite salt    Teriyaki sauce Marinade mixes   Worcestershire sauce  Others Baking powder   Cocoa and cocoa mixes Baking soda   Commercial casserole mixes Candy-caramels, chocolate  Dehydrated soups    Bars, fudge,nougats  Instant rice and pasta mixes Canned broth or soup  Maraschino cherries Cheese, aged and processed cheese and cheese spreads  Learning Assessment Quiz  Indicated T (for True) or F (for False) for each of the following statements:  1. _____ Fresh fruits and vegetables and unprocessed grains are generally low in sodium 2. _____ Water may contain a considerable amount of sodium, depending on the source 3. _____ You can always tell if a food is high in sodium by tasting it 4. _____ Certain laxatives my be high in sodium and should be avoided unless prescribed   by a physician or pharmacist 5. _____ Salt substitutes may be used freely by anyone on a sodium restricted diet 6. _____ Sodium is present in table salt, food additives and as a natural component of   most foods 7. _____ Table salt is approximately 90% sodium 8. _____ Limiting sodium intake may help prevent excess fluid accumulation in the body 9. _____ On a sodium-restricted diet, seasonings such as bouillon soy sauce, and    cooking wine should be used in place of table salt 10. _____ On an ingredient list, a product which lists monosodium glutamate as the first   ingredient is an appropriate food to include on a low sodium diet  Circle the best answer(s) to the following statements (Hint: there may be more than one correct  answer)  11. On a low-sodium diet, some acceptable snack items are:    A. Olives  F. Bean dip   K. Grapefruit juice    B. Salted Pretzels G. Commercial Popcorn   L. Canned peaches    C. Carrot Sticks  H. Bouillon   M. Unsalted nuts   D. [place] fries  I. Peanut butter crackers N. Salami   E. Sweet pickles J. Tomato Juice   O. Pizza  12.  Seasonings that may be used freely on a reduced - sodium diet include   A. Lemon wedges F.Monosodium glutamate K. Celery seed    B.Soysauce   G. Pepper   L. Mustard powder   C. Sea salt  H. Cooking wine  M. Onion flakes   D. Vinegar  E. Prepared horseradish N. Salsa   E. Sage   J. Worcestershire sauce  O. Chutney

## 2018-05-03 ENCOUNTER — Telehealth: Payer: Self-pay

## 2018-05-03 NOTE — Telephone Encounter (Signed)
-----   Message from Imogene Burn, PA-C sent at 05/02/2018  2:17 PM EST ----- Kidney function the same as last year a little on the low side.  Encourage him to drink more water.  Total bilirubin was slightly elevated at 1.4.  It was also elevated a year ago.  Should see PCP for this.  LDL cholesterol excellent at 56 as well as his cholesterol and triglycerides are well controlled.  CBC normal.  No further changes.

## 2018-05-03 NOTE — Telephone Encounter (Signed)
Left message for patient to call back  

## 2018-05-04 NOTE — Telephone Encounter (Signed)
Follow up  ° ° °Patient is returning call.  °

## 2018-05-04 NOTE — Telephone Encounter (Signed)
Pt advised his lab results and verbalized understanding to increase his fluid/ water intake.. he reports that he will make an appt with his PMD.. Dr. Rex Kras for follow up of his elevated Bilirubin.

## 2018-05-08 ENCOUNTER — Other Ambulatory Visit (HOSPITAL_COMMUNITY): Payer: Medicare Other

## 2018-06-12 ENCOUNTER — Ambulatory Visit (INDEPENDENT_AMBULATORY_CARE_PROVIDER_SITE_OTHER)
Admission: RE | Admit: 2018-06-12 | Discharge: 2018-06-12 | Disposition: A | Discharge status: Home / Self Care | Payer: Medicare Other | Source: Ambulatory Visit | Attending: Cardiology | Admitting: Cardiology

## 2018-06-12 ENCOUNTER — Ambulatory Visit (HOSPITAL_COMMUNITY): Payer: Medicare Other | Attending: Cardiovascular Disease

## 2018-06-12 DIAGNOSIS — I351 Nonrheumatic aortic (valve) insufficiency: Secondary | ICD-10-CM | POA: Diagnosis not present

## 2018-06-12 DIAGNOSIS — I719 Aortic aneurysm of unspecified site, without rupture: Secondary | ICD-10-CM | POA: Insufficient documentation

## 2018-06-12 DIAGNOSIS — I77810 Thoracic aortic ectasia: Secondary | ICD-10-CM | POA: Diagnosis not present

## 2018-06-12 DIAGNOSIS — I700 Atherosclerosis of aorta: Secondary | ICD-10-CM | POA: Diagnosis not present

## 2018-06-12 DIAGNOSIS — I712 Thoracic aortic aneurysm, without rupture: Secondary | ICD-10-CM | POA: Diagnosis not present

## 2018-06-12 MED ORDER — IOPAMIDOL (ISOVUE-370) INJECTION 76%
100.0000 mL | Freq: Once | INTRAVENOUS | Status: AC | PRN
  Administered 2018-06-12: 100 mL via INTRAVENOUS

## 2018-06-13 ENCOUNTER — Encounter: Payer: Self-pay | Admitting: Cardiology

## 2018-06-15 ENCOUNTER — Other Ambulatory Visit: Payer: Self-pay | Admitting: Cardiology

## 2018-06-15 ENCOUNTER — Telehealth: Payer: Self-pay

## 2018-06-15 DIAGNOSIS — I77810 Thoracic aortic ectasia: Secondary | ICD-10-CM

## 2018-06-15 DIAGNOSIS — Q231 Congenital insufficiency of aortic valve: Secondary | ICD-10-CM

## 2018-06-15 DIAGNOSIS — I719 Aortic aneurysm of unspecified site, without rupture: Secondary | ICD-10-CM

## 2018-06-15 NOTE — Telephone Encounter (Signed)
Spoke with the patient, he expressed understanding about his results. He had no further questions. Repeat echo ordered for 1 year.

## 2018-06-15 NOTE — Telephone Encounter (Signed)
-----   Message from Sueanne Margarita, MD sent at 06/13/2018  7:17 PM EST ----- The echo showed normal LVF with increased thickness of the heart muscle, stiffness of heart muscle, mild AR, mildly dilated ascending aorta at 64mm and aortic root 6mm and 12mm by CT.  Repeat echo in 1 year

## 2018-06-21 ENCOUNTER — Other Ambulatory Visit: Payer: Self-pay | Admitting: Cardiology

## 2018-06-21 DIAGNOSIS — E7800 Pure hypercholesterolemia, unspecified: Secondary | ICD-10-CM

## 2018-07-26 DIAGNOSIS — Z86010 Personal history of colonic polyps: Secondary | ICD-10-CM | POA: Diagnosis not present

## 2018-07-26 DIAGNOSIS — N400 Enlarged prostate without lower urinary tract symptoms: Secondary | ICD-10-CM | POA: Diagnosis not present

## 2018-07-26 DIAGNOSIS — Z0000 Encounter for general adult medical examination without abnormal findings: Secondary | ICD-10-CM | POA: Diagnosis not present

## 2018-07-26 DIAGNOSIS — J309 Allergic rhinitis, unspecified: Secondary | ICD-10-CM | POA: Diagnosis not present

## 2018-07-26 DIAGNOSIS — I10 Essential (primary) hypertension: Secondary | ICD-10-CM | POA: Diagnosis not present

## 2018-07-26 DIAGNOSIS — R972 Elevated prostate specific antigen [PSA]: Secondary | ICD-10-CM | POA: Diagnosis not present

## 2018-07-26 DIAGNOSIS — I77810 Thoracic aortic ectasia: Secondary | ICD-10-CM | POA: Diagnosis not present

## 2018-11-21 DIAGNOSIS — H52203 Unspecified astigmatism, bilateral: Secondary | ICD-10-CM | POA: Diagnosis not present

## 2018-11-21 DIAGNOSIS — H43813 Vitreous degeneration, bilateral: Secondary | ICD-10-CM | POA: Diagnosis not present

## 2018-11-21 DIAGNOSIS — H353132 Nonexudative age-related macular degeneration, bilateral, intermediate dry stage: Secondary | ICD-10-CM | POA: Diagnosis not present

## 2018-11-21 DIAGNOSIS — H26492 Other secondary cataract, left eye: Secondary | ICD-10-CM | POA: Diagnosis not present

## 2018-12-06 DIAGNOSIS — I10 Essential (primary) hypertension: Secondary | ICD-10-CM | POA: Diagnosis not present

## 2018-12-06 DIAGNOSIS — N400 Enlarged prostate without lower urinary tract symptoms: Secondary | ICD-10-CM | POA: Diagnosis not present

## 2018-12-06 DIAGNOSIS — E785 Hyperlipidemia, unspecified: Secondary | ICD-10-CM | POA: Diagnosis not present

## 2018-12-25 DIAGNOSIS — H353132 Nonexudative age-related macular degeneration, bilateral, intermediate dry stage: Secondary | ICD-10-CM | POA: Diagnosis not present

## 2018-12-25 DIAGNOSIS — H26492 Other secondary cataract, left eye: Secondary | ICD-10-CM | POA: Diagnosis not present

## 2019-01-04 ENCOUNTER — Telehealth: Payer: Self-pay | Admitting: Cardiology

## 2019-01-04 DIAGNOSIS — E7800 Pure hypercholesterolemia, unspecified: Secondary | ICD-10-CM

## 2019-01-04 MED ORDER — AMLODIPINE BESYLATE 5 MG PO TABS: 5.0000 mg | ORAL_TABLET | Freq: Every day | ORAL | 0 refills | Status: DC

## 2019-01-04 MED ORDER — ATORVASTATIN CALCIUM 10 MG PO TABS: ORAL_TABLET | ORAL | 0 refills | Status: DC

## 2019-01-04 NOTE — Telephone Encounter (Signed)
New Message     *STAT* If patient is at the pharmacy, call can be transferred to refill team.   1. Which medications need to be refilled? (please list name of each medication and dose if known) Amlodipine 5mg  and Atorvastatin 10mg   2. Which pharmacy/location (including street and city if local pharmacy) is medication to be sent to? [facility] pharmacy  3. Do they need a 30 day or 90 day supply? 90 day supply

## 2019-01-04 NOTE — Telephone Encounter (Signed)
Pt's medications were sent to pt's pharmacy as requested. Confirmation received.  

## 2019-01-07 ENCOUNTER — Telehealth: Payer: Self-pay | Admitting: Cardiology

## 2019-01-07 DIAGNOSIS — E785 Hyperlipidemia, unspecified: Secondary | ICD-10-CM | POA: Diagnosis not present

## 2019-01-07 DIAGNOSIS — I10 Essential (primary) hypertension: Secondary | ICD-10-CM | POA: Diagnosis not present

## 2019-01-07 DIAGNOSIS — N400 Enlarged prostate without lower urinary tract symptoms: Secondary | ICD-10-CM | POA: Diagnosis not present

## 2019-01-07 NOTE — Telephone Encounter (Signed)
°*  STAT* If patient is at the pharmacy, call can be transferred to refill team.   1. Which medications need to be refilled? (please list name of each medication and dose if known) new prescription- changing pharmacy Atorvastatin 10 mg and Amlodipine 5 mg  2. Which pharmacy/location (including street and city if local pharmacy) is medication to be sent to? [facility]  3. Do they need a 30 day or 90 day supply? 90 days and refills *

## 2019-01-07 NOTE — Telephone Encounter (Signed)
Pt's medications were already sent to pt's pharmacy as requested on 01/04/19. Confirmation received.

## 2019-01-16 DIAGNOSIS — Z23 Encounter for immunization: Secondary | ICD-10-CM | POA: Diagnosis not present

## 2019-01-29 DIAGNOSIS — R972 Elevated prostate specific antigen [PSA]: Secondary | ICD-10-CM | POA: Diagnosis not present

## 2019-01-29 DIAGNOSIS — N400 Enlarged prostate without lower urinary tract symptoms: Secondary | ICD-10-CM | POA: Diagnosis not present

## 2019-02-02 DIAGNOSIS — N400 Enlarged prostate without lower urinary tract symptoms: Secondary | ICD-10-CM | POA: Diagnosis not present

## 2019-02-02 DIAGNOSIS — I10 Essential (primary) hypertension: Secondary | ICD-10-CM | POA: Diagnosis not present

## 2019-02-02 DIAGNOSIS — E785 Hyperlipidemia, unspecified: Secondary | ICD-10-CM | POA: Diagnosis not present

## 2019-04-29 ENCOUNTER — Ambulatory Visit: Payer: Medicare HMO | Attending: Internal Medicine

## 2019-05-01 DIAGNOSIS — C61 Malignant neoplasm of prostate: Secondary | ICD-10-CM | POA: Diagnosis not present

## 2019-05-26 ENCOUNTER — Ambulatory Visit: Payer: Medicare HMO

## 2019-05-27 DIAGNOSIS — R69 Illness, unspecified: Secondary | ICD-10-CM | POA: Diagnosis not present

## 2019-05-28 NOTE — Progress Notes (Signed)
Virtual Visit via Telephone Note   This visit type was conducted due to national recommendations for restrictions regarding the COVID-19 Pandemic (e.g. social distancing) in an effort to limit this patient's exposure and mitigate transmission in our community.  Due to his co-morbid illnesses, this patient is at least at moderate risk for complications without adequate follow up.  This format is felt to be most appropriate for this patient at this time.  All issues noted in this document were discussed and addressed.  A limited physical exam was performed with this format.  Please refer to the patient's chart for his consent to telehealth for [facility].   Evaluation Performed:  Follow-up visit  This visit type was conducted due to national recommendations for restrictions regarding the COVID-19 Pandemic (e.g. social distancing).  This format is felt to be most appropriate for this patient at this time.  All issues noted in this document were discussed and addressed.  No physical exam was performed (except for noted visual exam findings with Video Visits).  Please refer to the patient's chart (MyChart message for video visits and phone note for telephone visits) for the patient's consent to telehealth for [facility].  Date:  05/29/2019   ID:  Stephen [name], DOB [birth year], MRN [id number]  Patient Location:  Home  Provider location:   [place]  PCP:  Hulan Fess, MD Cardiologist:  Fransico Him, MD  Electrophysiologist:  None   Chief Complaint:  BAV, HTN, dilated aorta  History of Present Illness:    Stephen [name] is a 79 y.o. male who presents via audio/video conferencing for a telehealth visit today.    Stephen [name] is a 79 y.o. male with a hx of HTN, bicuspid AV and dilated aorta. He is here today for followup and is doing well.  HE denies any chest pain or pressure, SOB, DOE, PND, orthopnea, LE edema, dizziness, palpitations or syncope. He is compliant with his meds and is  tolerating meds with no SE.    The patient does not have symptoms concerning for COVID-19 infection (fever, chills, cough, or new shortness of breath).    Prior CV studies:   The following studies were reviewed today:  Outside labs from PCP from [facility], 2D echo, Chest CTA  Past Medical History:  Diagnosis Date  . Adenomatous colon polyp   . Allergic rhinitis   . Aortic dilatation (HCC)    73mm ascending and root on echo and 52mm by CT 05/2018  . Bicuspid aortic valve   . Hypertension    Past Surgical History:  Procedure Laterality Date  . COLONOSCOPY W/ POLYPECTOMY    . TONSILLECTOMY    . UMBILICAL HERNIA REPAIR    . VASECTOMY       Current Meds  Medication Sig  . amLODipine (NORVASC) 5 MG tablet Take 1 tablet (5 mg total) by mouth daily. Please make yearly appt with Dr. Radford Pax for January for future refills. 1st attempt  . aspirin 81 MG tablet Take 81 mg by mouth daily.  Marland Kitchen atorvastatin (LIPITOR) 10 MG tablet TAKE 1 TABLET(10 MG) BY MOUTH DAILY. Please make yearly appt with Dr. Radford Pax for January for future refills. 1st attempt  . esomeprazole (NEXIUM) 40 MG capsule Take 40 mg by mouth daily.  Marland Kitchen loratadine (CLARITIN) 10 MG tablet Take 10 mg by mouth daily as needed.   Marland Kitchen losartan (COZAAR) 100 MG tablet Take 100 mg by mouth daily.  . Multiple Vitamin (MULTIVITAMIN) capsule Take 1 capsule by mouth daily.  Marland Kitchen  Multiple Vitamins-Minerals (EYE VITAMINS PO) Take by mouth.  . Tamsulosin HCl (FLOMAX) 0.4 MG CAPS Take 0.4 mg by mouth daily.     Allergies:   Amoxicillin   Social History   Tobacco Use  . Smoking status: Former Smoker    Packs/day: 1.00    Years: 10.00    Pack years: 10.00    Types: Cigarettes    Quit date: 04/18/1968    Years since quitting: 51.1  . Smokeless tobacco: Never Used  Substance Use Topics  . Alcohol use: Yes    Alcohol/week: 2.0 standard drinks    Types: 2 Cans of beer per week  . Drug use: No     Family Hx: The patient's family history includes  Breast cancer in his sister; Heart attack in his father; Heart failure in his father; Uterine cancer in his sister.  ROS:   Please see the history of present illness.     All other systems reviewed and are negative.   Labs/Other Tests and Data Reviewed:    Recent Labs: No results found for requested labs within last 8760 hours.   Recent Lipid Panel Lab Results  Component Value Date/Time   CHOL 140 05/01/2018 09:08 AM   TRIG 58 05/01/2018 09:08 AM   HDL 72 05/01/2018 09:08 AM   CHOLHDL 1.9 05/01/2018 09:08 AM   LDLCALC 56 05/01/2018 09:08 AM    Wt Readings from Last 3 Encounters:  05/29/19 233 lb (105.7 kg)  05/01/18 233 lb 1.9 oz (105.7 kg)  05/18/17 235 lb 12.8 oz (107 kg)     Objective:    Vital Signs:  BP 121/77   Pulse 62   Ht 6\' 1"  (1.854 m)   Wt 233 lb (105.7 kg)   BMI 30.74 kg/m     ASSESSMENT & PLAN:    1.  Bicuspid AV -2D echo 05/2018 showed stable BAV with mild AI  2.  HTN -BP controlled -continue amlodipine 5mg  daily, Losartan 100mg  daily -last Creatinine from PCP on [facility] was 0.76 -last K+ from PCP on K[facility] was 4.5 -check BMET   3. Ascending aorta aneurysm -2D echo 05/2018 showed mildly dilated ascending aorta at 13mm and Chest CTA 16mm -continue statin  -BP well controlled -repeat 2D echo to reassess  4. HLD -LDL goal < 70 -LDL was 64 and ALT 15 in April 2020 -continue atorvastatin 10mg  daily -Check FLP and ALT  COVID-19 Education: The signs and symptoms of COVID-19 were discussed with the patient and how to seek care for testing (follow up with PCP or arrange E-visit).  The importance of social distancing was discussed today.  Patient Risk:   After full review of this patient's clinical status, I feel that they are at least moderate risk at this time.  Time:   Today, I have spent 20 minutes on telemedicine discussing medical problems including BAV, HTN, HLD, dilated ascending aorta and  reviewing patient's chart including outside labs from  PCP on KPN and 2D echo and chest CTA.  Medication Adjustments/Labs and Tests Ordered: Current medicines are reviewed at length with the patient today.  Concerns regarding medicines are outlined above.  Tests Ordered: No orders of the defined types were placed in this encounter.  Medication Changes: No orders of the defined types were placed in this encounter.   Disposition:  Follow up in 1 year(s)  Signed, Fransico Him, MD  05/29/2019 8:31 AM    [facility]

## 2019-05-29 ENCOUNTER — Encounter: Payer: Self-pay | Admitting: Cardiology

## 2019-05-29 ENCOUNTER — Telehealth (INDEPENDENT_AMBULATORY_CARE_PROVIDER_SITE_OTHER): Payer: Medicare HMO | Admitting: Cardiology

## 2019-05-29 ENCOUNTER — Other Ambulatory Visit: Payer: Self-pay

## 2019-05-29 VITALS — BP 121/77 | HR 62 | Ht 73.0 in | Wt 233.0 lb

## 2019-05-29 DIAGNOSIS — I10 Essential (primary) hypertension: Secondary | ICD-10-CM

## 2019-05-29 DIAGNOSIS — E785 Hyperlipidemia, unspecified: Secondary | ICD-10-CM | POA: Diagnosis not present

## 2019-05-29 DIAGNOSIS — Q231 Congenital insufficiency of aortic valve: Secondary | ICD-10-CM

## 2019-05-29 DIAGNOSIS — I719 Aortic aneurysm of unspecified site, without rupture: Secondary | ICD-10-CM

## 2019-05-29 DIAGNOSIS — E7800 Pure hypercholesterolemia, unspecified: Secondary | ICD-10-CM | POA: Diagnosis not present

## 2019-05-29 NOTE — Patient Instructions (Signed)
Medication Instructions:  Your physician recommends that you continue on your current medications as directed. Please refer to the Current Medication list given to you today.  *If you need a refill on your cardiac medications before your next appointment, please call your pharmacy*  Lab Work: Fast Lipids and CMET on April 21st, 2021.  If you have labs (blood work) drawn today and your tests are completely normal, you will receive your results only by: Marland Kitchen MyChart Message (if you have MyChart) OR . A paper copy in the mail If you have any lab test that is abnormal or we need to change your treatment, we will call you to review the results.  Follow-Up: At [facility], you and your health needs are our priority.  As part of our continuing mission to provide you with exceptional heart care, we have created designated Provider Care Teams.  These Care Teams include your primary Cardiologist (physician) and Advanced Practice Providers (APPs -  Physician Assistants and Nurse Practitioners) who all work together to provide you with the care you need, when you need it.  Your next appointment:   1 year(s)  The format for your next appointment:   Either In Person or Virtual  Provider:   Fransico Him, MD

## 2019-05-31 ENCOUNTER — Ambulatory Visit: Payer: Medicare Other | Admitting: Cardiology

## 2019-06-07 DIAGNOSIS — N400 Enlarged prostate without lower urinary tract symptoms: Secondary | ICD-10-CM | POA: Diagnosis not present

## 2019-06-07 DIAGNOSIS — I10 Essential (primary) hypertension: Secondary | ICD-10-CM | POA: Diagnosis not present

## 2019-06-07 DIAGNOSIS — E785 Hyperlipidemia, unspecified: Secondary | ICD-10-CM | POA: Diagnosis not present

## 2019-06-11 ENCOUNTER — Ambulatory Visit: Payer: Medicare HMO

## 2019-06-17 ENCOUNTER — Ambulatory Visit (HOSPITAL_COMMUNITY): Payer: Medicare HMO | Attending: Cardiology

## 2019-06-17 ENCOUNTER — Other Ambulatory Visit: Payer: Self-pay

## 2019-06-17 DIAGNOSIS — I77810 Thoracic aortic ectasia: Secondary | ICD-10-CM | POA: Insufficient documentation

## 2019-06-17 DIAGNOSIS — Q231 Congenital insufficiency of aortic valve: Secondary | ICD-10-CM | POA: Diagnosis not present

## 2019-06-17 DIAGNOSIS — I719 Aortic aneurysm of unspecified site, without rupture: Secondary | ICD-10-CM | POA: Diagnosis not present

## 2019-06-19 ENCOUNTER — Telehealth: Payer: Self-pay

## 2019-06-19 DIAGNOSIS — I712 Thoracic aortic aneurysm, without rupture, unspecified: Secondary | ICD-10-CM

## 2019-06-19 NOTE — Telephone Encounter (Signed)
-----   Message from Sueanne Margarita, MD sent at 06/17/2019  5:21 PM EST ----- 2D echo showed normal heart function with increased LV thickness, mildly enlarged RV, mild aortic aneursym at 37mm in the root and 33mm in the ascending aorta.  This appears stable. Please get Chest CTA in 1 year to followup on aorta.

## 2019-06-24 ENCOUNTER — Other Ambulatory Visit: Payer: Self-pay

## 2019-06-24 DIAGNOSIS — E785 Hyperlipidemia, unspecified: Secondary | ICD-10-CM | POA: Diagnosis not present

## 2019-06-24 DIAGNOSIS — E7800 Pure hypercholesterolemia, unspecified: Secondary | ICD-10-CM

## 2019-06-24 DIAGNOSIS — I10 Essential (primary) hypertension: Secondary | ICD-10-CM | POA: Diagnosis not present

## 2019-06-24 DIAGNOSIS — N400 Enlarged prostate without lower urinary tract symptoms: Secondary | ICD-10-CM | POA: Diagnosis not present

## 2019-06-24 MED ORDER — AMLODIPINE BESYLATE 5 MG PO TABS: 5.0000 mg | ORAL_TABLET | Freq: Every day | ORAL | 0 refills | Status: DC

## 2019-06-24 MED ORDER — ATORVASTATIN CALCIUM 10 MG PO TABS: ORAL_TABLET | ORAL | 3 refills | Status: DC

## 2019-06-29 DIAGNOSIS — S63519A Sprain of carpal joint of unspecified wrist, initial encounter: Secondary | ICD-10-CM | POA: Diagnosis not present

## 2019-07-01 DIAGNOSIS — M25531 Pain in right wrist: Secondary | ICD-10-CM | POA: Diagnosis not present

## 2019-08-07 ENCOUNTER — Other Ambulatory Visit: Payer: Medicare HMO | Admitting: *Deleted

## 2019-08-07 ENCOUNTER — Other Ambulatory Visit: Payer: Self-pay

## 2019-08-07 DIAGNOSIS — E7800 Pure hypercholesterolemia, unspecified: Secondary | ICD-10-CM | POA: Diagnosis not present

## 2019-08-07 DIAGNOSIS — I10 Essential (primary) hypertension: Secondary | ICD-10-CM

## 2019-08-07 DIAGNOSIS — E785 Hyperlipidemia, unspecified: Secondary | ICD-10-CM | POA: Diagnosis not present

## 2019-08-07 DIAGNOSIS — I719 Aortic aneurysm of unspecified site, without rupture: Secondary | ICD-10-CM

## 2019-08-07 DIAGNOSIS — Q231 Congenital insufficiency of aortic valve: Secondary | ICD-10-CM | POA: Diagnosis not present

## 2019-08-07 LAB — COMPREHENSIVE METABOLIC PANEL
ALT: 13 IU/L (ref 0–44)
AST: 19 IU/L (ref 0–40)
Albumin/Globulin Ratio: 1.8 (ref 1.2–2.2)
Albumin: 4.3 g/dL (ref 3.7–4.7)
Alkaline Phosphatase: 71 IU/L (ref 39–117)
BUN/Creatinine Ratio: 13 (ref 10–24)
BUN: 10 mg/dL (ref 8–27)
Bilirubin Total: 1.3 mg/dL — ABNORMAL HIGH (ref 0.0–1.2)
CO2: 25 mmol/L (ref 20–29)
Calcium: 9.2 mg/dL (ref 8.6–10.2)
Chloride: 103 mmol/L (ref 96–106)
Creatinine, Ser: 0.77 mg/dL (ref 0.76–1.27)
GFR calc Af Amer: 101 mL/min/{1.73_m2} (ref >59)
GFR calc non Af Amer: 88 mL/min/{1.73_m2} (ref >59)
Globulin, Total: 2.4 g/dL (ref 1.5–4.5)
Glucose: 85 mg/dL (ref 65–99)
Potassium: 4.4 mmol/L (ref 3.5–5.2)
Sodium: 142 mmol/L (ref 134–144)
Total Protein: 6.7 g/dL (ref 6.0–8.5)

## 2019-08-07 LAB — LIPID PANEL
Chol/HDL Ratio: 2.2 ratio (ref 0.0–5.0)
Cholesterol, Total: 140 mg/dL (ref 100–199)
HDL: 65 mg/dL (ref >39)
LDL Chol Calc (NIH): 57 mg/dL (ref 0–99)
Triglycerides: 98 mg/dL (ref 0–149)
VLDL Cholesterol Cal: 18 mg/dL (ref 5–40)

## 2019-08-14 DIAGNOSIS — Z8546 Personal history of malignant neoplasm of prostate: Secondary | ICD-10-CM | POA: Diagnosis not present

## 2019-08-14 DIAGNOSIS — Z86010 Personal history of colonic polyps: Secondary | ICD-10-CM | POA: Diagnosis not present

## 2019-08-14 DIAGNOSIS — E785 Hyperlipidemia, unspecified: Secondary | ICD-10-CM | POA: Diagnosis not present

## 2019-08-14 DIAGNOSIS — I77810 Thoracic aortic ectasia: Secondary | ICD-10-CM | POA: Diagnosis not present

## 2019-08-14 DIAGNOSIS — Z23 Encounter for immunization: Secondary | ICD-10-CM | POA: Diagnosis not present

## 2019-08-14 DIAGNOSIS — I10 Essential (primary) hypertension: Secondary | ICD-10-CM | POA: Diagnosis not present

## 2019-08-14 DIAGNOSIS — Z0000 Encounter for general adult medical examination without abnormal findings: Secondary | ICD-10-CM | POA: Diagnosis not present

## 2019-08-19 DIAGNOSIS — N5201 Erectile dysfunction due to arterial insufficiency: Secondary | ICD-10-CM | POA: Diagnosis not present

## 2019-08-28 DIAGNOSIS — I10 Essential (primary) hypertension: Secondary | ICD-10-CM | POA: Diagnosis not present

## 2019-08-28 DIAGNOSIS — N400 Enlarged prostate without lower urinary tract symptoms: Secondary | ICD-10-CM | POA: Diagnosis not present

## 2019-08-28 DIAGNOSIS — E785 Hyperlipidemia, unspecified: Secondary | ICD-10-CM | POA: Diagnosis not present

## 2019-08-28 DIAGNOSIS — Z8546 Personal history of malignant neoplasm of prostate: Secondary | ICD-10-CM | POA: Diagnosis not present

## 2019-09-20 ENCOUNTER — Other Ambulatory Visit: Payer: Self-pay | Admitting: Cardiology

## 2019-09-20 DIAGNOSIS — N400 Enlarged prostate without lower urinary tract symptoms: Secondary | ICD-10-CM | POA: Diagnosis not present

## 2019-09-20 DIAGNOSIS — Z8546 Personal history of malignant neoplasm of prostate: Secondary | ICD-10-CM | POA: Diagnosis not present

## 2019-09-20 DIAGNOSIS — I10 Essential (primary) hypertension: Secondary | ICD-10-CM | POA: Diagnosis not present

## 2019-09-20 DIAGNOSIS — E785 Hyperlipidemia, unspecified: Secondary | ICD-10-CM | POA: Diagnosis not present

## 2019-10-02 DIAGNOSIS — R69 Illness, unspecified: Secondary | ICD-10-CM | POA: Diagnosis not present

## 2019-10-25 DIAGNOSIS — C61 Malignant neoplasm of prostate: Secondary | ICD-10-CM | POA: Diagnosis not present

## 2019-11-07 DIAGNOSIS — N400 Enlarged prostate without lower urinary tract symptoms: Secondary | ICD-10-CM | POA: Diagnosis not present

## 2019-11-07 DIAGNOSIS — N5201 Erectile dysfunction due to arterial insufficiency: Secondary | ICD-10-CM | POA: Diagnosis not present

## 2019-11-07 DIAGNOSIS — C61 Malignant neoplasm of prostate: Secondary | ICD-10-CM | POA: Diagnosis not present

## 2019-12-24 DIAGNOSIS — E785 Hyperlipidemia, unspecified: Secondary | ICD-10-CM | POA: Diagnosis not present

## 2019-12-24 DIAGNOSIS — K219 Gastro-esophageal reflux disease without esophagitis: Secondary | ICD-10-CM | POA: Diagnosis not present

## 2019-12-24 DIAGNOSIS — I10 Essential (primary) hypertension: Secondary | ICD-10-CM | POA: Diagnosis not present

## 2019-12-24 DIAGNOSIS — Z8546 Personal history of malignant neoplasm of prostate: Secondary | ICD-10-CM | POA: Diagnosis not present

## 2019-12-24 DIAGNOSIS — N400 Enlarged prostate without lower urinary tract symptoms: Secondary | ICD-10-CM | POA: Diagnosis not present

## 2019-12-31 DIAGNOSIS — H43813 Vitreous degeneration, bilateral: Secondary | ICD-10-CM | POA: Diagnosis not present

## 2019-12-31 DIAGNOSIS — H524 Presbyopia: Secondary | ICD-10-CM | POA: Diagnosis not present

## 2019-12-31 DIAGNOSIS — H26492 Other secondary cataract, left eye: Secondary | ICD-10-CM | POA: Diagnosis not present

## 2019-12-31 DIAGNOSIS — H35372 Puckering of macula, left eye: Secondary | ICD-10-CM | POA: Diagnosis not present

## 2020-01-14 DIAGNOSIS — R69 Illness, unspecified: Secondary | ICD-10-CM | POA: Diagnosis not present

## 2020-01-16 DIAGNOSIS — H26492 Other secondary cataract, left eye: Secondary | ICD-10-CM | POA: Diagnosis not present

## 2020-03-18 DIAGNOSIS — Z8546 Personal history of malignant neoplasm of prostate: Secondary | ICD-10-CM | POA: Diagnosis not present

## 2020-03-18 DIAGNOSIS — N400 Enlarged prostate without lower urinary tract symptoms: Secondary | ICD-10-CM | POA: Diagnosis not present

## 2020-03-18 DIAGNOSIS — K219 Gastro-esophageal reflux disease without esophagitis: Secondary | ICD-10-CM | POA: Diagnosis not present

## 2020-03-18 DIAGNOSIS — I10 Essential (primary) hypertension: Secondary | ICD-10-CM | POA: Diagnosis not present

## 2020-03-18 DIAGNOSIS — E785 Hyperlipidemia, unspecified: Secondary | ICD-10-CM | POA: Diagnosis not present

## 2020-04-07 DIAGNOSIS — R69 Illness, unspecified: Secondary | ICD-10-CM | POA: Diagnosis not present

## 2020-05-08 ENCOUNTER — Telehealth: Payer: Self-pay

## 2020-05-08 DIAGNOSIS — I712 Thoracic aortic aneurysm, without rupture, unspecified: Secondary | ICD-10-CM

## 2020-05-08 NOTE — Telephone Encounter (Signed)
-----   Message from Olean Ree, RT sent at 05/08/2020  9:45 AM EST ----- Regarding: BMET Needed Hi!   I have this patient scheduled for 2/2 @8 :30. I need him  to have a BMET before then. Could you please place an order for me?  Thanks! Nunzio Cobbs

## 2020-05-08 NOTE — Telephone Encounter (Signed)
BMP ordered and scheduled.  Pt aware.

## 2020-05-13 ENCOUNTER — Other Ambulatory Visit: Payer: Medicare HMO | Admitting: *Deleted

## 2020-05-13 ENCOUNTER — Other Ambulatory Visit: Payer: Self-pay

## 2020-05-13 DIAGNOSIS — I712 Thoracic aortic aneurysm, without rupture, unspecified: Secondary | ICD-10-CM

## 2020-05-13 LAB — BASIC METABOLIC PANEL
BUN/Creatinine Ratio: 10 (ref 10–24)
BUN: 7 mg/dL — ABNORMAL LOW (ref 8–27)
CO2: 23 mmol/L (ref 20–29)
Calcium: 9 mg/dL (ref 8.6–10.2)
Chloride: 103 mmol/L (ref 96–106)
Creatinine, Ser: 0.72 mg/dL — ABNORMAL LOW (ref 0.76–1.27)
GFR calc Af Amer: 103 mL/min/{1.73_m2} (ref >59)
GFR calc non Af Amer: 89 mL/min/{1.73_m2} (ref >59)
Glucose: 86 mg/dL (ref 65–99)
Potassium: 4.8 mmol/L (ref 3.5–5.2)
Sodium: 141 mmol/L (ref 134–144)

## 2020-05-15 DIAGNOSIS — K219 Gastro-esophageal reflux disease without esophagitis: Secondary | ICD-10-CM | POA: Diagnosis not present

## 2020-05-15 DIAGNOSIS — Z8546 Personal history of malignant neoplasm of prostate: Secondary | ICD-10-CM | POA: Diagnosis not present

## 2020-05-15 DIAGNOSIS — N400 Enlarged prostate without lower urinary tract symptoms: Secondary | ICD-10-CM | POA: Diagnosis not present

## 2020-05-15 DIAGNOSIS — E785 Hyperlipidemia, unspecified: Secondary | ICD-10-CM | POA: Diagnosis not present

## 2020-05-15 DIAGNOSIS — I10 Essential (primary) hypertension: Secondary | ICD-10-CM | POA: Diagnosis not present

## 2020-05-20 ENCOUNTER — Inpatient Hospital Stay: Admission: RE | Admit: 2020-05-20 | Payer: Medicare HMO | Source: Ambulatory Visit

## 2020-05-26 ENCOUNTER — Encounter: Payer: Self-pay | Admitting: Cardiology

## 2020-05-26 ENCOUNTER — Other Ambulatory Visit: Payer: Self-pay

## 2020-05-26 ENCOUNTER — Telehealth (INDEPENDENT_AMBULATORY_CARE_PROVIDER_SITE_OTHER): Payer: Medicare HMO | Admitting: Cardiology

## 2020-05-26 VITALS — BP 127/72 | HR 60 | Ht 73.0 in | Wt 225.0 lb

## 2020-05-26 DIAGNOSIS — I10 Essential (primary) hypertension: Secondary | ICD-10-CM | POA: Diagnosis not present

## 2020-05-26 DIAGNOSIS — Q231 Congenital insufficiency of aortic valve: Secondary | ICD-10-CM

## 2020-05-26 DIAGNOSIS — I77810 Thoracic aortic ectasia: Secondary | ICD-10-CM | POA: Diagnosis not present

## 2020-05-26 DIAGNOSIS — E785 Hyperlipidemia, unspecified: Secondary | ICD-10-CM

## 2020-05-26 MED ORDER — ESOMEPRAZOLE MAGNESIUM 40 MG PO CPDR: 40.0000 mg | DELAYED_RELEASE_CAPSULE | Freq: Two times a day (BID) | ORAL | 3 refills | Status: DC

## 2020-05-26 NOTE — Patient Instructions (Signed)
Medication Instructions:  Your physician has recommended you make the following change in your medication: 1) INCREASE Nexium to 40 mg twice daily  *If you need a refill on your cardiac medications before your next appointment, please call your pharmacy*   Lab Work: Fasting lipids and ALT on 04/06 between 7:30am and 4:30pm  If you have labs (blood work) drawn today and your tests are completely normal, you will receive your results only by: Marland Kitchen MyChart Message (if you have MyChart) OR . A paper copy in the mail If you have any lab test that is abnormal or we need to change your treatment, we will call you to review the results.   Follow-Up: At [facility], you and your health needs are our priority.  As part of our continuing mission to provide you with exceptional heart care, we have created designated Provider Care Teams.  These Care Teams include your primary Cardiologist (physician) and Advanced Practice Providers (APPs -  Physician Assistants and Nurse Practitioners) who all work together to provide you with the care you need, when you need it.  Your next appointment:   1 year(s)  The format for your next appointment:   In Person  Provider:   You may see Fransico Him, MD or one of the following Advanced Practice Providers on your designated Care Team:    Melina Copa, PA-C  Ermalinda Barrios, PA-C

## 2020-05-26 NOTE — Progress Notes (Signed)
Virtual Visit via Video Note   This visit type was conducted due to national recommendations for restrictions regarding the COVID-19 Pandemic (e.g. social distancing) in an effort to limit this patient's exposure and mitigate transmission in our community.  Due to his co-morbid illnesses, this patient is at least at moderate risk for complications without adequate follow up.  This format is felt to be most appropriate for this patient at this time.  All issues noted in this document were discussed and addressed.  A limited physical exam was performed with this format.  Please refer to the patient's chart for his consent to telehealth for [facility].   Evaluation Performed:  Follow-up visit  This visit type was conducted due to national recommendations for restrictions regarding the COVID-19 Pandemic (e.g. social distancing).  This format is felt to be most appropriate for this patient at this time.  All issues noted in this document were discussed and addressed.  No physical exam was performed (except for noted visual exam findings with Video Visits).  Please refer to the patient's chart (MyChart message for video visits and phone note for telephone visits) for the patient's consent to telehealth for [facility].  Date:  05/26/2020   ID:  Stephen [name], DOB Dec 03, 1940, MRN [id number]  Patient Location:  Home  Provider location:   [place]  PCP:  Hulan Fess, MD Cardiologist:  Fransico Him, MD  Electrophysiologist:  None   Chief Complaint:  BAV, HTN, dilated aorta  History of Present Illness:    Stephen [name] is a 80 y.o. male who presents via audio/video conferencing for a telehealth visit today.    Stephen [name] is a 80 y.o. male with a hx of HTN, bicuspid AV and dilated aorta. He is here today for followup and is doing well.  He denies any chest pain or pressure, SOB, DOE, PND, orthopnea, LE edema, dizziness, palpitations or syncope. He is compliant with his meds and is  tolerating meds with no SE.    The patient does not have symptoms concerning for COVID-19 infection (fever, chills, cough, or new shortness of breath).    Prior CV studies:   The following studies were reviewed today:  Outside labs from PCP from [facility]  2D echo 06/2019 IMPRESSIONS    1. Left ventricular ejection fraction, by estimation, is 60 to 65%. The  left ventricle has normal function. The left ventricle has no regional  wall motion abnormalities. There is moderate asymmetric left ventricular  hypertrophy. Left ventricular  diastolic parameters were normal.  2. Moderate asymmetric hypertrophy measuring 10mm in basal septum (46mm in  posterior wall)  3. Right ventricular systolic function is normal. The right ventricular  size is mildly enlarged. There is mildly elevated pulmonary artery  systolic pressure.  4. The mitral valve is normal in structure and function. Trivial mitral  valve regurgitation.  5. The aortic valve is tricuspid. Aortic valve regurgitation is mild.  Mild aortic valve sclerosis is present, with no evidence of aortic valve  stenosis.  6. Aortic dilatation noted. There is dilatation of the aortic root  measuring 43 mm. There is dilation of the ascending aorta measuring 73mm.  Dilation of the aortic arch measuring 22mm   Past Medical History:  Diagnosis Date  . Adenomatous colon polyp   . Allergic rhinitis   . Aortic dilatation (HCC)    20mm ascending and root on echo and 71mm by CT 05/2018  . Bicuspid aortic valve   . HLD (hyperlipidemia)   .  Hypertension    Past Surgical History:  Procedure Laterality Date  . COLONOSCOPY W/ POLYPECTOMY    . TONSILLECTOMY    . UMBILICAL HERNIA REPAIR    . VASECTOMY       Current Meds  Medication Sig  . amLODipine (NORVASC) 5 MG tablet Take 1 tablet (5 mg total) by mouth daily.  Marland Kitchen aspirin 81 MG tablet Take 81 mg by mouth daily.  Marland Kitchen atorvastatin (LIPITOR) 10 MG tablet TAKE 1 TABLET(10 MG) BY MOUTH DAILY.  Marland Kitchen  esomeprazole (NEXIUM) 40 MG capsule Take 40 mg by mouth daily.  Marland Kitchen loratadine (CLARITIN) 10 MG tablet Take 10 mg by mouth daily as needed.   Marland Kitchen losartan (COZAAR) 100 MG tablet Take 100 mg by mouth daily.  . Multiple Vitamin (MULTIVITAMIN) capsule Take 1 capsule by mouth daily.  . Multiple Vitamins-Minerals (EYE VITAMINS PO) Take by mouth.  . Tamsulosin HCl (FLOMAX) 0.4 MG CAPS Take 0.4 mg by mouth daily.     Allergies:   Amoxicillin   Social History   Tobacco Use  . Smoking status: Former Smoker    Packs/day: 1.00    Years: 10.00    Pack years: 10.00    Types: Cigarettes    Quit date: 04/18/1968    Years since quitting: 52.1  . Smokeless tobacco: Never Used  Substance Use Topics  . Alcohol use: Yes    Alcohol/week: 2.0 standard drinks    Types: 2 Cans of beer per week  . Drug use: No     Family Hx: The patient's family history includes Breast cancer in his sister; Heart attack in his father; Heart failure in his father; Uterine cancer in his sister.  ROS:   Please see the history of present illness.     All other systems reviewed and are negative.   Labs/Other Tests and Data Reviewed:    Recent Labs: 08/07/2019: ALT 13 05/13/2020: BUN 7; Creatinine, Ser 0.72; Potassium 4.8; Sodium 141   Recent Lipid Panel Lab Results  Component Value Date/Time   CHOL 140 08/07/2019 07:48 AM   TRIG 98 08/07/2019 07:48 AM   HDL 65 08/07/2019 07:48 AM   CHOLHDL 2.2 08/07/2019 07:48 AM   LDLCALC 57 08/07/2019 07:48 AM    Wt Readings from Last 3 Encounters:  05/26/20 225 lb (102.1 kg)  05/29/19 233 lb (105.7 kg)  05/01/18 233 lb 1.9 oz (105.7 kg)     Objective:    Vital Signs:  BP 127/72   Pulse 60   Ht 6\' 1"  (1.854 m)   Wt 225 lb (102.1 kg)   BMI 29.69 kg/m    Well nourished, well developed male in no acute distress. Well appearing, alert and conversant, regular work of breathing,  good skin color  Eyes- anicteric mouth- oral mucosa is pink  neuro- grossly intact skin-  no apparent rash or lesions or cyanosis   ASSESSMENT & PLAN:    1.  Bicuspid AV -2D echo 06/2019 showed stable BAV with mild AI  2.  HTN -BP is well controlled on exam today -continue amlodipine 5mg  daily, Losartan 100mg  daily -SCr stable at 0.72 and K+ 4.8 in January 2022  3. Ascending aorta aneurysm -2D echo 06/2019 showed mildly dilated aortic root at 33mm and Chest CTA 41mm ascending aorta and aortic root -continue statin  -BP well controlled -will get Chest CTA to reassess aorta>>scheduled for next month  4. HLD -LDL goal < 70 -LDL was 57 in April 2021 -continue atorvastatin 10mg  daily -repeat  FLP and CMET in April  5.  Cough -he has an intermittent cough that is associated with mucous production and usually occurs after meals -suspect this is related to GERD and not ARB as it is not a dry cough -I will start Protonix 40mg  daily -I have instructed him to see his PCP if sx do not resolve in the next month.   Followup with me in 1 year.  COVID-19 Education: The signs and symptoms of COVID-19 were discussed with the patient and how to seek care for testing (follow up with PCP or arrange E-visit).  The importance of social distancing was discussed today.  Patient Risk:   After full review of this patient's clinical status, I feel that they are at least moderate risk at this time.  Time:   Today, I have spent 20 minutes on telemedicine discussing medical problems including BAV, HTN, HLD, dilated ascending aorta and  reviewing patient's chart including outside labs from PCP on KPN and 2D echo and chest CTA.  Medication Adjustments/Labs and Tests Ordered: Current medicines are reviewed at length with the patient today.  Concerns regarding medicines are outlined above.  Tests Ordered: No orders of the defined types were placed in this encounter.  Medication Changes: No orders of the defined types were placed in this encounter.   Disposition:  Follow up in 1  year(s)  Signed, Fransico Him, MD  05/26/2020 8:23 AM    [facility]

## 2020-05-26 NOTE — Addendum Note (Signed)
Addended by: Antonieta Iba on: 05/26/2020 09:02 AM   Modules accepted: Orders

## 2020-06-01 DIAGNOSIS — C61 Malignant neoplasm of prostate: Secondary | ICD-10-CM | POA: Diagnosis not present

## 2020-06-02 ENCOUNTER — Other Ambulatory Visit: Payer: Self-pay

## 2020-06-02 DIAGNOSIS — I10 Essential (primary) hypertension: Secondary | ICD-10-CM

## 2020-06-04 ENCOUNTER — Telehealth: Payer: Self-pay

## 2020-06-04 NOTE — Telephone Encounter (Addendum)
**Note De-Identified Neriyah Cercone Obfuscation** I started a Esomeprazole PA through covermymeds and received this message:  DEJAY KRONK Key: [id number] - PA Case ID: [id number] Outcome: Approved on February 16 Your request has been approved until 04/17/2021 Drug: Esomeprazole Magnesium 40MG  dr capsules Form: [employer] Electronic PA Form (2017 NCPDP)  I have notified [facility] of this approval.

## 2020-06-08 DIAGNOSIS — N5201 Erectile dysfunction due to arterial insufficiency: Secondary | ICD-10-CM | POA: Diagnosis not present

## 2020-06-08 DIAGNOSIS — N400 Enlarged prostate without lower urinary tract symptoms: Secondary | ICD-10-CM | POA: Diagnosis not present

## 2020-06-08 DIAGNOSIS — C61 Malignant neoplasm of prostate: Secondary | ICD-10-CM | POA: Diagnosis not present

## 2020-06-11 DIAGNOSIS — I10 Essential (primary) hypertension: Secondary | ICD-10-CM | POA: Diagnosis not present

## 2020-06-11 DIAGNOSIS — N400 Enlarged prostate without lower urinary tract symptoms: Secondary | ICD-10-CM | POA: Diagnosis not present

## 2020-06-11 DIAGNOSIS — K219 Gastro-esophageal reflux disease without esophagitis: Secondary | ICD-10-CM | POA: Diagnosis not present

## 2020-06-11 DIAGNOSIS — E785 Hyperlipidemia, unspecified: Secondary | ICD-10-CM | POA: Diagnosis not present

## 2020-06-12 ENCOUNTER — Other Ambulatory Visit: Payer: Self-pay

## 2020-06-12 ENCOUNTER — Other Ambulatory Visit: Payer: Medicare HMO | Admitting: *Deleted

## 2020-06-12 ENCOUNTER — Other Ambulatory Visit: Payer: Self-pay | Admitting: Cardiology

## 2020-06-12 DIAGNOSIS — I10 Essential (primary) hypertension: Secondary | ICD-10-CM

## 2020-06-12 DIAGNOSIS — E7800 Pure hypercholesterolemia, unspecified: Secondary | ICD-10-CM

## 2020-06-13 LAB — COMPREHENSIVE METABOLIC PANEL
ALT: 15 IU/L (ref 0–44)
AST: 19 IU/L (ref 0–40)
Albumin/Globulin Ratio: 1.9 (ref 1.2–2.2)
Albumin: 4.3 g/dL (ref 3.7–4.7)
Alkaline Phosphatase: 68 IU/L (ref 44–121)
BUN/Creatinine Ratio: 13 (ref 10–24)
BUN: 9 mg/dL (ref 8–27)
Bilirubin Total: 1.3 mg/dL — ABNORMAL HIGH (ref 0.0–1.2)
CO2: 23 mmol/L (ref 20–29)
Calcium: 8.9 mg/dL (ref 8.6–10.2)
Chloride: 100 mmol/L (ref 96–106)
Creatinine, Ser: 0.68 mg/dL — ABNORMAL LOW (ref 0.76–1.27)
GFR calc Af Amer: 106 mL/min/{1.73_m2} (ref >59)
GFR calc non Af Amer: 91 mL/min/{1.73_m2} (ref >59)
Globulin, Total: 2.3 g/dL (ref 1.5–4.5)
Glucose: 88 mg/dL (ref 65–99)
Potassium: 4.7 mmol/L (ref 3.5–5.2)
Sodium: 141 mmol/L (ref 134–144)
Total Protein: 6.6 g/dL (ref 6.0–8.5)

## 2020-06-17 ENCOUNTER — Other Ambulatory Visit: Payer: Self-pay

## 2020-06-17 ENCOUNTER — Ambulatory Visit (INDEPENDENT_AMBULATORY_CARE_PROVIDER_SITE_OTHER)
Admission: RE | Admit: 2020-06-17 | Discharge: 2020-06-17 | Disposition: A | Discharge status: Home / Self Care | Payer: Medicare HMO | Source: Ambulatory Visit | Attending: Cardiology | Admitting: Cardiology

## 2020-06-17 DIAGNOSIS — I712 Thoracic aortic aneurysm, without rupture, unspecified: Secondary | ICD-10-CM

## 2020-06-17 MED ORDER — IOHEXOL 350 MG/ML SOLN
100.0000 mL | Freq: Once | INTRAVENOUS | Status: AC | PRN
  Administered 2020-06-17: 100 mL via INTRAVENOUS

## 2020-06-18 ENCOUNTER — Telehealth: Payer: Self-pay

## 2020-06-18 DIAGNOSIS — Q2381 Bicuspid aortic valve: Secondary | ICD-10-CM

## 2020-06-18 DIAGNOSIS — J8410 Pulmonary fibrosis, unspecified: Secondary | ICD-10-CM

## 2020-06-18 DIAGNOSIS — I2510 Atherosclerotic heart disease of native coronary artery without angina pectoris: Secondary | ICD-10-CM

## 2020-06-18 DIAGNOSIS — E785 Hyperlipidemia, unspecified: Secondary | ICD-10-CM

## 2020-06-18 DIAGNOSIS — Q231 Congenital insufficiency of aortic valve: Secondary | ICD-10-CM

## 2020-06-18 NOTE — Telephone Encounter (Signed)
The patient has been notified of the result and verbalized understanding.  All questions (if any) were answered. Antonieta Iba, RN 06/18/2020 12:24 PM  Calcium score has been ordered. Referral to pulmonology has been placed.

## 2020-06-18 NOTE — Telephone Encounter (Signed)
-----   Message from Sueanne Margarita, MD sent at 06/17/2020 12:39 PM EST ----- Chest CTA showed stable dilatation of the aortic root at 4cm, early pulmonary fibrosis of the lungs and scattered coronary artery calcifications.  Please get a coronary calcium score.  Cough may be due to lung fibrosis.  Please refer to Dr. Chase Caller

## 2020-07-16 ENCOUNTER — Other Ambulatory Visit: Payer: Self-pay

## 2020-07-16 ENCOUNTER — Encounter: Payer: Self-pay | Admitting: Cardiology

## 2020-07-16 ENCOUNTER — Ambulatory Visit (INDEPENDENT_AMBULATORY_CARE_PROVIDER_SITE_OTHER)
Admission: RE | Admit: 2020-07-16 | Discharge: 2020-07-16 | Disposition: A | Discharge status: Home / Self Care | Payer: Self-pay | Source: Ambulatory Visit | Attending: Cardiology | Admitting: Cardiology

## 2020-07-16 DIAGNOSIS — I77819 Aortic ectasia, unspecified site: Secondary | ICD-10-CM | POA: Insufficient documentation

## 2020-07-16 DIAGNOSIS — R931 Abnormal findings on diagnostic imaging of heart and coronary circulation: Secondary | ICD-10-CM | POA: Insufficient documentation

## 2020-07-16 DIAGNOSIS — I2584 Coronary atherosclerosis due to calcified coronary lesion: Secondary | ICD-10-CM

## 2020-07-16 DIAGNOSIS — I2510 Atherosclerotic heart disease of native coronary artery without angina pectoris: Secondary | ICD-10-CM

## 2020-07-16 DIAGNOSIS — I700 Atherosclerosis of aorta: Secondary | ICD-10-CM | POA: Insufficient documentation

## 2020-07-20 DIAGNOSIS — Z0389 Encounter for observation for other suspected diseases and conditions ruled out: Secondary | ICD-10-CM | POA: Diagnosis not present

## 2020-07-20 DIAGNOSIS — C61 Malignant neoplasm of prostate: Secondary | ICD-10-CM | POA: Diagnosis not present

## 2020-07-22 ENCOUNTER — Other Ambulatory Visit: Payer: Medicare HMO

## 2020-07-23 ENCOUNTER — Other Ambulatory Visit: Payer: Medicare HMO

## 2020-07-23 ENCOUNTER — Other Ambulatory Visit: Payer: Self-pay

## 2020-07-23 DIAGNOSIS — E785 Hyperlipidemia, unspecified: Secondary | ICD-10-CM

## 2020-07-23 LAB — COMPREHENSIVE METABOLIC PANEL
ALT: 14 IU/L (ref 0–44)
AST: 21 IU/L (ref 0–40)
Albumin/Globulin Ratio: 1.6 (ref 1.2–2.2)
Albumin: 4.1 g/dL (ref 3.7–4.7)
Alkaline Phosphatase: 70 IU/L (ref 44–121)
BUN/Creatinine Ratio: 11 (ref 10–24)
BUN: 9 mg/dL (ref 8–27)
Bilirubin Total: 1 mg/dL (ref 0.0–1.2)
CO2: 25 mmol/L (ref 20–29)
Calcium: 9.2 mg/dL (ref 8.6–10.2)
Chloride: 103 mmol/L (ref 96–106)
Creatinine, Ser: 0.85 mg/dL (ref 0.76–1.27)
Globulin, Total: 2.6 g/dL (ref 1.5–4.5)
Glucose: 85 mg/dL (ref 65–99)
Potassium: 4.6 mmol/L (ref 3.5–5.2)
Sodium: 141 mmol/L (ref 134–144)
Total Protein: 6.7 g/dL (ref 6.0–8.5)
eGFR: 89 mL/min/{1.73_m2} (ref >59)

## 2020-07-23 LAB — LIPID PANEL
Chol/HDL Ratio: 2 ratio (ref 0.0–5.0)
Cholesterol, Total: 143 mg/dL (ref 100–199)
HDL: 70 mg/dL (ref >39)
LDL Chol Calc (NIH): 61 mg/dL (ref 0–99)
Triglycerides: 53 mg/dL (ref 0–149)
VLDL Cholesterol Cal: 12 mg/dL (ref 5–40)

## 2020-07-27 DIAGNOSIS — Z8546 Personal history of malignant neoplasm of prostate: Secondary | ICD-10-CM | POA: Diagnosis not present

## 2020-07-27 DIAGNOSIS — R972 Elevated prostate specific antigen [PSA]: Secondary | ICD-10-CM | POA: Diagnosis not present

## 2020-09-03 DIAGNOSIS — Z0000 Encounter for general adult medical examination without abnormal findings: Secondary | ICD-10-CM | POA: Diagnosis not present

## 2020-09-03 DIAGNOSIS — Z1389 Encounter for screening for other disorder: Secondary | ICD-10-CM | POA: Diagnosis not present

## 2020-09-08 ENCOUNTER — Institutional Professional Consult (permissible substitution): Payer: Medicare HMO | Admitting: Internal Medicine

## 2020-09-13 DIAGNOSIS — E785 Hyperlipidemia, unspecified: Secondary | ICD-10-CM | POA: Diagnosis not present

## 2020-09-13 DIAGNOSIS — I10 Essential (primary) hypertension: Secondary | ICD-10-CM | POA: Diagnosis not present

## 2020-09-13 DIAGNOSIS — K219 Gastro-esophageal reflux disease without esophagitis: Secondary | ICD-10-CM | POA: Diagnosis not present

## 2020-09-13 DIAGNOSIS — Z8546 Personal history of malignant neoplasm of prostate: Secondary | ICD-10-CM | POA: Diagnosis not present

## 2020-09-13 DIAGNOSIS — N400 Enlarged prostate without lower urinary tract symptoms: Secondary | ICD-10-CM | POA: Diagnosis not present

## 2020-09-22 ENCOUNTER — Ambulatory Visit: Payer: Medicare HMO | Admitting: Pulmonary Disease

## 2020-09-22 ENCOUNTER — Other Ambulatory Visit: Payer: Self-pay

## 2020-09-22 ENCOUNTER — Encounter: Payer: Self-pay | Admitting: Internal Medicine

## 2020-09-22 VITALS — BP 118/74 | HR 68 | Temp 97.3°F | Ht 72.0 in | Wt 230.0 lb

## 2020-09-22 DIAGNOSIS — J84112 Idiopathic pulmonary fibrosis: Secondary | ICD-10-CM | POA: Diagnosis not present

## 2020-09-22 DIAGNOSIS — J849 Interstitial pulmonary disease, unspecified: Secondary | ICD-10-CM

## 2020-09-22 LAB — CBC WITH DIFFERENTIAL/PLATELET
Basophils Absolute: 0 10*3/uL (ref 0.0–0.1)
Basophils Relative: 0.7 % (ref 0.0–3.0)
Eosinophils Absolute: 0.2 10*3/uL (ref 0.0–0.7)
Eosinophils Relative: 3.9 % (ref 0.0–5.0)
HCT: 46.1 % (ref 39.0–52.0)
Hemoglobin: 15.4 g/dL (ref 13.0–17.0)
Lymphocytes Relative: 19.6 % (ref 12.0–46.0)
Lymphs Abs: 1.3 10*3/uL (ref 0.7–4.0)
MCHC: 33.4 g/dL (ref 30.0–36.0)
MCV: 92.2 fl (ref 78.0–100.0)
Monocytes Absolute: 0.5 10*3/uL (ref 0.1–1.0)
Monocytes Relative: 7.1 % (ref 3.0–12.0)
Neutro Abs: 4.4 10*3/uL (ref 1.4–7.7)
Neutrophils Relative %: 68.7 % (ref 43.0–77.0)
Platelets: 214 10*3/uL (ref 150.0–400.0)
RBC: 5 Mil/uL (ref 4.22–5.81)
RDW: 13.3 % (ref 11.5–15.5)
WBC: 6.4 10*3/uL (ref 4.0–10.5)

## 2020-09-22 LAB — SEDIMENTATION RATE: Sed Rate: 10 mm/hr (ref 0–20)

## 2020-09-22 NOTE — Patient Instructions (Addendum)
Blood work  Schedule pFTs HRCT in September

## 2020-09-22 NOTE — Assessment & Plan Note (Signed)
He does seem to have ILD.  On my review infiltrates are peripheral and bibasilar with suggest UIP pattern obvious honeycombing is not present.  We will proceed with high-resolution CT chest in 3 months to see if we can have a definitive diagnosis of UIP and also to look for interval progression.  Fortunately he seems to have had minimal progression compared to 2019 and stable disease compared to 2020.  There is no history of prior pneumonia or obvious environmental exposure.  He does not seem to have any stigmata of collagen vascular disease. We will obtain basic serology for rheumatoid arthritis and a screening ANA. Fortunately he does not seem to desaturate on exertion which is reassuring.  We will quantitate lung functions with PFTs. I reviewed the natural history of IPF.  I think eventually we he may need an antifibrotic medications but we will obtain all testing and a more definitive diagnosis before we proceed in that direction.  I note that his LFTs were normal in April

## 2020-09-22 NOTE — Progress Notes (Signed)
Subjective:    Patient ID: Stephen [name], male    DOB: 26-Jul-1940, 80 y.o.   MRN: [id number]  HPI  80 year old remote smoker presents for evaluation of abnormal CT scan showing interstitial thickening. He underwent CT angiogram chest to evaluate dilated aortic root .  I have independently reviewed these films, they show peripheral and bibasilar interstitial thickening with some groundglass changes, appear to be stable when compared to February 2020 but mildly increased when compared to 2019.  No previous films for are available from 2019.  On questioning he reports a dry cough for the past 5 years.  When he wakes up in the morning he has minimal sputum production.  He does have reflux symptoms and increasing Nexium to twice daily seem to help with this cough.  He denies shortness of breath but does admit to a sedentary lifestyle. He smoked less than 10 pack years before he quit in 1970.  He is retired and worked for 47 years in [employer].  He reports exposure to smoke and metal vapors especially in the early years and had to have blood lead levels checked.  I note normal LFTs 07/2020.  Reviewed cardiology consultation   Significant tests/ events reviewed Ambulatory saturation 09/2020 heart rate stayed in the 80s, oxygen saturation dropped from 98 to 96%  CTA chest 06/2020 Peripheral ground-glass opacities and interstitial thickening bilaterally most compatible with scarring/fibrosis - stable since feb 2020 Small nodule in the right middle lobe at the right lung base measures 3 mm, stable 4 cm aortic root,stable  Past Medical History:  Diagnosis Date  . Adenomatous colon polyp   . Agatston coronary artery calcium score between 200 and 399    coronary Ca score 234 by CT 06/2020  . Allergic rhinitis   . Aortic atherosclerosis ([place])    noted on chest CT 06/2020  . Aortic dilatation (HCC)    75mm by chest CTA 06/2020  . Bicuspid aortic valve   . HLD (hyperlipidemia)   . Hypertension      Past Surgical History:  Procedure Laterality Date  . COLONOSCOPY W/ POLYPECTOMY    . TONSILLECTOMY    . UMBILICAL HERNIA REPAIR    . VASECTOMY      Allergies  Allergen Reactions  . Amoxicillin     hives    Social History   Socioeconomic History  . Marital status: Married    Spouse name: Not on file  . Number of children: Not on file  . Years of education: Not on file  . Highest education level: Not on file  Occupational History  . Occupation: Control and instrumentation engineer  Tobacco Use  . Smoking status: Former Smoker    Packs/day: 1.00    Years: 10.00    Pack years: 10.00    Types: Cigarettes    Quit date: 04/18/1968    Years since quitting: 52.4  . Smokeless tobacco: Never Used  Substance and Sexual Activity  . Alcohol use: Yes    Alcohol/week: 2.0 standard drinks    Types: 2 Cans of beer per week  . Drug use: No  . Sexual activity: Not on file  Other Topics Concern  . Not on file  Social History Narrative  . Not on file   Social Determinants of Health   Financial Resource Strain: Not on file  Food Insecurity: Not on file  Transportation Needs: Not on file  Physical Activity: Not on file  Stress: Not on file  Social Connections: Not on file  Intimate Partner Violence: Not on file     Family History  Problem Relation Age of Onset  . Heart attack Father   . Heart failure Father   . Breast cancer Sister   . Uterine cancer Sister       Review of Systems Mild joint stiffness in the mornings, no joint swelling Feet swelling which he attributes to amlodipine Nonproductive   Constitutional: negative for anorexia, fevers and sweats  Eyes: negative for irritation, redness and visual disturbance  Ears, nose, mouth, throat, and face: negative for earaches, epistaxis, nasal congestion and sore throat  Respiratory: negative for  dyspnea on exertion, sputum and wheezing  Cardiovascular: negative for chest pain, dyspnea, orthopnea, palpitations and syncope   Gastrointestinal: negative for abdominal pain, constipation, diarrhea, melena, nausea and vomiting  Genitourinary:negative for dysuria, frequency and hematuria  Hematologic/lymphatic: negative for bleeding, easy bruising and lymphadenopathy  Musculoskeletal:negative for arthralgias, muscle weakness and stiff joints  Neurological: negative for coordination problems, gait problems, headaches and weakness  Endocrine: negative for diabetic symptoms including polydipsia, polyuria and weight loss     Objective:   Physical Exam  Gen. Pleasant, well-nourished, in no distress, normal affect ENT - no pallor,icterus, no post nasal drip Neck: No JVD, no thyromegaly, no carotid bruits Lungs: no use of accessory muscles, no dullness to percussion, bibasal dry rales no rhonchi  Cardiovascular: Rhythm regular, heart sounds  normal, no murmurs or gallops, no peripheral edema Abdomen: soft and non-tender, no hepatosplenomegaly, BS normal. Musculoskeletal: No deformities, no cyanosis or clubbing Neuro:  alert, non focal       Assessment & Plan:

## 2020-09-22 NOTE — Addendum Note (Signed)
Addended by: Birder Robson on: 09/22/2020 10:36 AM   Modules accepted: Orders

## 2020-09-22 NOTE — Addendum Note (Signed)
Addended by: Suzzanne Cloud E on: 09/22/2020 10:41 AM   Modules accepted: Orders

## 2020-09-23 LAB — ANA: Anti Nuclear Antibody (ANA): NEGATIVE

## 2020-09-23 LAB — CYCLIC CITRUL PEPTIDE ANTIBODY, IGG: Cyclic Citrullin Peptide Ab: <16 UNITS

## 2020-09-25 NOTE — Progress Notes (Signed)
Called and left message on voicemail to please return phone call to go over lab result. Contact number provided.

## 2020-10-02 NOTE — Progress Notes (Signed)
Called and went over lab results per Dr Elsworth Soho with patient. All questions answered and patient expressed full understanding. Nothing further needed at this time.

## 2020-10-27 DIAGNOSIS — I77810 Thoracic aortic ectasia: Secondary | ICD-10-CM | POA: Diagnosis not present

## 2020-10-27 DIAGNOSIS — I10 Essential (primary) hypertension: Secondary | ICD-10-CM | POA: Diagnosis not present

## 2020-10-27 DIAGNOSIS — Z0000 Encounter for general adult medical examination without abnormal findings: Secondary | ICD-10-CM | POA: Diagnosis not present

## 2020-10-27 DIAGNOSIS — E7800 Pure hypercholesterolemia, unspecified: Secondary | ICD-10-CM | POA: Diagnosis not present

## 2020-10-27 DIAGNOSIS — Z23 Encounter for immunization: Secondary | ICD-10-CM | POA: Diagnosis not present

## 2020-10-27 DIAGNOSIS — J84112 Idiopathic pulmonary fibrosis: Secondary | ICD-10-CM | POA: Diagnosis not present

## 2020-10-27 DIAGNOSIS — C61 Malignant neoplasm of prostate: Secondary | ICD-10-CM | POA: Diagnosis not present

## 2020-11-13 DIAGNOSIS — I77810 Thoracic aortic ectasia: Secondary | ICD-10-CM | POA: Diagnosis not present

## 2020-11-13 DIAGNOSIS — J84112 Idiopathic pulmonary fibrosis: Secondary | ICD-10-CM | POA: Diagnosis not present

## 2020-11-13 DIAGNOSIS — I10 Essential (primary) hypertension: Secondary | ICD-10-CM | POA: Diagnosis not present

## 2020-11-13 DIAGNOSIS — C61 Malignant neoplasm of prostate: Secondary | ICD-10-CM | POA: Diagnosis not present

## 2020-11-13 DIAGNOSIS — E7800 Pure hypercholesterolemia, unspecified: Secondary | ICD-10-CM | POA: Diagnosis not present

## 2020-11-13 DIAGNOSIS — Z23 Encounter for immunization: Secondary | ICD-10-CM | POA: Diagnosis not present

## 2020-12-08 DIAGNOSIS — N400 Enlarged prostate without lower urinary tract symptoms: Secondary | ICD-10-CM | POA: Diagnosis not present

## 2020-12-08 DIAGNOSIS — I10 Essential (primary) hypertension: Secondary | ICD-10-CM | POA: Diagnosis not present

## 2020-12-08 DIAGNOSIS — E785 Hyperlipidemia, unspecified: Secondary | ICD-10-CM | POA: Diagnosis not present

## 2020-12-08 DIAGNOSIS — K219 Gastro-esophageal reflux disease without esophagitis: Secondary | ICD-10-CM | POA: Diagnosis not present

## 2020-12-08 DIAGNOSIS — E7800 Pure hypercholesterolemia, unspecified: Secondary | ICD-10-CM | POA: Diagnosis not present

## 2020-12-25 ENCOUNTER — Ambulatory Visit (INDEPENDENT_AMBULATORY_CARE_PROVIDER_SITE_OTHER)
Admission: RE | Admit: 2020-12-25 | Discharge: 2020-12-25 | Disposition: A | Discharge status: Home / Self Care | Payer: Medicare HMO | Source: Ambulatory Visit | Attending: Pulmonary Disease | Admitting: Pulmonary Disease

## 2020-12-25 ENCOUNTER — Other Ambulatory Visit: Payer: Self-pay

## 2020-12-25 DIAGNOSIS — J84112 Idiopathic pulmonary fibrosis: Secondary | ICD-10-CM

## 2020-12-25 DIAGNOSIS — J479 Bronchiectasis, uncomplicated: Secondary | ICD-10-CM | POA: Diagnosis not present

## 2020-12-25 DIAGNOSIS — R911 Solitary pulmonary nodule: Secondary | ICD-10-CM | POA: Diagnosis not present

## 2020-12-25 DIAGNOSIS — I700 Atherosclerosis of aorta: Secondary | ICD-10-CM | POA: Diagnosis not present

## 2021-01-01 NOTE — Progress Notes (Signed)
Called pt and there was no answer-LMTCB °

## 2021-01-08 ENCOUNTER — Encounter: Payer: Self-pay | Admitting: *Deleted

## 2021-01-08 NOTE — Progress Notes (Signed)
LMTCB and mailed letter asking him to call to discuss

## 2021-01-11 ENCOUNTER — Telehealth: Payer: Self-pay | Admitting: Pulmonary Disease

## 2021-01-11 NOTE — Telephone Encounter (Signed)
Call made to patient, confirmed DOB. Made aware of CT results per RA. Appt for Pft and OV made.   Nothing further needed at this time.

## 2021-01-26 DIAGNOSIS — Z8546 Personal history of malignant neoplasm of prostate: Secondary | ICD-10-CM | POA: Diagnosis not present

## 2021-02-02 DIAGNOSIS — H43813 Vitreous degeneration, bilateral: Secondary | ICD-10-CM | POA: Diagnosis not present

## 2021-02-02 DIAGNOSIS — H353132 Nonexudative age-related macular degeneration, bilateral, intermediate dry stage: Secondary | ICD-10-CM | POA: Diagnosis not present

## 2021-02-02 DIAGNOSIS — H524 Presbyopia: Secondary | ICD-10-CM | POA: Diagnosis not present

## 2021-02-02 DIAGNOSIS — H35372 Puckering of macula, left eye: Secondary | ICD-10-CM | POA: Diagnosis not present

## 2021-02-08 DIAGNOSIS — Z20822 Contact with and (suspected) exposure to covid-19: Secondary | ICD-10-CM | POA: Diagnosis not present

## 2021-03-03 ENCOUNTER — Encounter: Payer: Self-pay | Admitting: Pulmonary Disease

## 2021-03-03 ENCOUNTER — Ambulatory Visit: Payer: Medicare HMO | Admitting: Pulmonary Disease

## 2021-03-03 ENCOUNTER — Ambulatory Visit (INDEPENDENT_AMBULATORY_CARE_PROVIDER_SITE_OTHER): Payer: Medicare HMO | Admitting: Pulmonary Disease

## 2021-03-03 ENCOUNTER — Other Ambulatory Visit: Payer: Self-pay

## 2021-03-03 DIAGNOSIS — J849 Interstitial pulmonary disease, unspecified: Secondary | ICD-10-CM

## 2021-03-03 DIAGNOSIS — J84112 Idiopathic pulmonary fibrosis: Secondary | ICD-10-CM

## 2021-03-03 LAB — PULMONARY FUNCTION TEST
DL/VA % pred: 92 %
DL/VA: 3.59 ml/min/mmHg/L
DLCO cor % pred: 83 %
DLCO cor: 21.24 ml/min/mmHg
DLCO unc % pred: 83 %
DLCO unc: 21.24 ml/min/mmHg
FEF 25-75 Post: 3.66 L/sec
FEF 25-75 Pre: 4.98 L/sec
FEF2575-%Change-Post: -26 %
FEF2575-%Pred-Post: 172 %
FEF2575-%Pred-Pre: 235 %
FEV1-%Change-Post: -3 %
FEV1-%Pred-Post: 95 %
FEV1-%Pred-Pre: 99 %
FEV1-Post: 2.91 L
FEV1-Pre: 3.03 L
FEV1FVC-%Change-Post: 0 %
FEV1FVC-%Pred-Pre: 125 %
FEV6-%Change-Post: -3 %
FEV6-%Pred-Post: 81 %
FEV6-%Pred-Pre: 84 %
FEV6-Post: 3.24 L
FEV6-Pre: 3.36 L
FEV6FVC-%Change-Post: 0 %
FEV6FVC-%Pred-Post: 106 %
FEV6FVC-%Pred-Pre: 106 %
FVC-%Change-Post: -3 %
FVC-%Pred-Post: 76 %
FVC-%Pred-Pre: 79 %
FVC-Post: 3.25 L
FVC-Pre: 3.36 L
Post FEV1/FVC ratio: 90 %
Post FEV6/FVC ratio: 100 %
Pre FEV1/FVC ratio: 90 %
Pre FEV6/FVC Ratio: 100 %
RV % pred: 44 %
RV: 1.19 L
TLC % pred: 62 %
TLC: 4.57 L

## 2021-03-03 NOTE — Progress Notes (Signed)
   Subjective:    Patient ID: Stephen [name], male    DOB: Dec 10, 1940, 80 y.o.   MRN: [id number]  HPI  80 yo remote smoker for FU of ILD -minimal progression compared to 2019 and stable disease compared to 2020  He smoked less than 10 pack years before he quit in 1970.  He is retired and worked for 47 years in [employer].  He reports exposure to smoke and metal vapors especially in the early years and had to have blood lead levels checked.  Dyspnea is at baseline, denies cough or wheezing. We reviewed CT and PFTs today  Significant tests/ events reviewed  PFTs 02/2021 FVC 79%, no airway obstruction, TLC 62%, DLCO 21.2/83%  HRCT 12/2020 probable UIP, no honeycombing  Serology 09/2020 ANA, CCP neg Ambulatory saturation 09/2020 heart rate stayed in the 80s, oxygen saturation dropped from 98 to 96%   CTA chest 06/2020 Peripheral ground-glass opacities and interstitial thickening bilaterally most compatible with scarring/fibrosis - stable since feb 2020 Small nodule in the right middle lobe at the right lung base measures 3 mm, stable 4 cm aortic root,stable  Review of Systems neg for any significant sore throat, dysphagia, itching, sneezing, nasal congestion or excess/ purulent secretions, fever, chills, sweats, unintended wt loss, pleuritic or exertional cp, hempoptysis, orthopnea pnd or change in chronic leg swelling. Also denies presyncope, palpitations, heartburn, abdominal pain, nausea, vomiting, diarrhea or change in bowel or urinary habits, dysuria,hematuria, rash, arthralgias, visual complaints, headache, numbness weakness or ataxia.     Objective:   Physical Exam  Gen. Pleasant, well-nourished, in no distress ENT - no thrush, no pallor/icterus,no post nasal drip Neck: No JVD, no thyromegaly, no carotid bruits Lungs: no use of accessory muscles, no dullness to percussion, bibasal fine dry rales no rhonchi  Cardiovascular: Rhythm regular, heart sounds  normal, no murmurs or  gallops, no peripheral edema Musculoskeletal: No deformities, no cyanosis or clubbing        Assessment & Plan:

## 2021-03-03 NOTE — Assessment & Plan Note (Signed)
HRCT pattern seems to favor probable UIP, serologies negative and PFTs did not show significant restriction, TLC is low but this is likely an abnormal value, FVC near normal range and DLCO is also maintained His symptom burden is low.  We discussed therapeutic options for IPF including Ofev and pirfenidone and their side effect profile.  He prefers to hold off until there is any clear evidence of disease progression and I would agree with this approach I encouraged him to maintain an active lifestyle

## 2021-03-03 NOTE — Patient Instructions (Signed)
You have pulmonary fibrosis We discussed natural history & medications Spirometry next visit

## 2021-03-03 NOTE — Progress Notes (Signed)
PFT done today. 

## 2021-03-03 NOTE — Assessment & Plan Note (Signed)
Weight loss encouraged 

## 2021-03-09 DIAGNOSIS — N400 Enlarged prostate without lower urinary tract symptoms: Secondary | ICD-10-CM | POA: Diagnosis not present

## 2021-03-09 DIAGNOSIS — E785 Hyperlipidemia, unspecified: Secondary | ICD-10-CM | POA: Diagnosis not present

## 2021-03-09 DIAGNOSIS — I10 Essential (primary) hypertension: Secondary | ICD-10-CM | POA: Diagnosis not present

## 2021-03-09 DIAGNOSIS — E7800 Pure hypercholesterolemia, unspecified: Secondary | ICD-10-CM | POA: Diagnosis not present

## 2021-03-09 DIAGNOSIS — K219 Gastro-esophageal reflux disease without esophagitis: Secondary | ICD-10-CM | POA: Diagnosis not present

## 2021-05-07 DIAGNOSIS — U071 COVID-19: Secondary | ICD-10-CM | POA: Diagnosis not present

## 2021-06-05 ENCOUNTER — Other Ambulatory Visit: Payer: Self-pay | Admitting: Cardiology

## 2021-06-05 DIAGNOSIS — E7800 Pure hypercholesterolemia, unspecified: Secondary | ICD-10-CM

## 2021-06-10 ENCOUNTER — Other Ambulatory Visit: Payer: Self-pay | Admitting: Cardiology

## 2021-06-10 NOTE — Telephone Encounter (Signed)
Medication Refill (Newest Message First) Sueanne Margarita, MD   minutes ago (4:19 PM)   PCP needs to refill this further

## 2021-06-10 NOTE — Telephone Encounter (Signed)
Pt's pharmacy is requesting a refill on esomeprazole. Would Dr. Turner like to refill this medication? Please address 

## 2021-07-08 DIAGNOSIS — E7800 Pure hypercholesterolemia, unspecified: Secondary | ICD-10-CM | POA: Diagnosis not present

## 2021-07-08 DIAGNOSIS — I10 Essential (primary) hypertension: Secondary | ICD-10-CM | POA: Diagnosis not present

## 2021-07-08 DIAGNOSIS — K219 Gastro-esophageal reflux disease without esophagitis: Secondary | ICD-10-CM | POA: Diagnosis not present

## 2021-07-09 ENCOUNTER — Other Ambulatory Visit: Payer: Self-pay | Admitting: Cardiology

## 2021-07-09 DIAGNOSIS — E7800 Pure hypercholesterolemia, unspecified: Secondary | ICD-10-CM

## 2021-07-12 ENCOUNTER — Other Ambulatory Visit: Payer: Self-pay | Admitting: Urology

## 2021-07-12 ENCOUNTER — Other Ambulatory Visit: Payer: Self-pay | Admitting: Cardiology

## 2021-07-12 DIAGNOSIS — R972 Elevated prostate specific antigen [PSA]: Secondary | ICD-10-CM

## 2021-07-12 DIAGNOSIS — Z8546 Personal history of malignant neoplasm of prostate: Secondary | ICD-10-CM

## 2021-07-13 ENCOUNTER — Other Ambulatory Visit: Payer: Self-pay | Admitting: Cardiology

## 2021-07-13 DIAGNOSIS — E7800 Pure hypercholesterolemia, unspecified: Secondary | ICD-10-CM

## 2021-07-26 DIAGNOSIS — Z8546 Personal history of malignant neoplasm of prostate: Secondary | ICD-10-CM | POA: Diagnosis not present

## 2021-08-04 ENCOUNTER — Other Ambulatory Visit: Payer: Self-pay | Admitting: Cardiology

## 2021-08-04 DIAGNOSIS — E7800 Pure hypercholesterolemia, unspecified: Secondary | ICD-10-CM

## 2021-08-07 NOTE — Progress Notes (Signed)
?Cardiology Office Note:   ? ?Date:  08/10/2021  ? ?ID:  Stephen [name], DOB [birth year], MRN [id number] ? ?PCP:  Aretta Nip, MD ?  ?[facility] Providers ?Cardiologist:  Fransico Him, MD    ? ?Referring MD: Aretta Nip, MD  ? ?Chief Complaint: Annual follow-up dilated aorta ? ?History of Present Illness:   ? ?Stephen [name] is a 81 y.o. male with a hx of hypertension, bicuspid AV, aortic atherosclerosis, dilated aorta, interstitial lung disease, and former smoker.  ? ?Established care prior to 2014 for bicuspid aortic valve and aortic dilatation. Found to have interstitial lung disease and is followed by pulmonary. He has maintained consistent follow-up for bicuspid AV and dilated aortic root. Echo 06/2019 revealed normal LVEF, normal diastolic parameters, mildly elevated PA pressure, tricuspid aortic valve, mild AI, mild AV sclerosis with no evidence of stenosis, aortic root dilatation measuring 43 mm, ascending aorta mesuring 42 mm, and dilation of aortic arc measuring 40 mm. Chest CT 06/2020 revealed stable dilatation of aortic root at 40 mm. Coronary calcium score to evaluated coronary calcifications seen on CT. Ca score 234, 43% age and sex matched control.  ? ?Today, he is here alone for follow-up. He reports he is feeling well and remains active working in his garden. He denies chest pain, shortness of breath, lower extremity edema, fatigue, palpitations, melena, hematuria, hemoptysis, diaphoresis, weakness, presyncope, syncope, orthopnea, and PND. He has no specific cardiac complaints. Is undergoing testing for enlarged prostate. ? ?Past Medical History:  ?Diagnosis Date  ? Adenomatous colon polyp   ? Agatston coronary artery calcium score between 200 and 399   ? coronary Ca score 234 by CT 06/2020  ? Allergic rhinitis   ? Aortic atherosclerosis ([place])   ? noted on chest CT 06/2020  ? Aortic dilatation (HCC)   ? 62m by chest CTA 06/2020  ? Bicuspid aortic valve   ? HLD (hyperlipidemia)   ?  Hypertension   ? ? ?Past Surgical History:  ?Procedure Laterality Date  ? COLONOSCOPY W/ POLYPECTOMY    ? TONSILLECTOMY    ? UMBILICAL HERNIA REPAIR    ? VASECTOMY    ? ? ?Current Medications: ?Current Meds  ?Medication Sig  ? amLODipine (NORVASC) 5 MG tablet TAKE ONE TABLET BY MOUTH ONCE DAILY  ? aspirin 81 MG tablet Take 81 mg by mouth daily.  ? atorvastatin (LIPITOR) 10 MG tablet TAKE ONE TABLET BY MOUTH ONCE DAILY  ? esomeprazole (NEXIUM) 40 MG capsule TAKE ONE CAPSULE BY MOUTH EVERY MORNING and TAKE ONE CAPSULE EVERYDAY AT BEDTIME. please keep appointment  ? loratadine (CLARITIN) 10 MG tablet Take 10 mg by mouth daily as needed.   ? losartan (COZAAR) 100 MG tablet Take 100 mg by mouth daily.  ? Multiple Vitamin (MULTIVITAMIN) capsule Take 1 capsule by mouth daily.  ? Multiple Vitamins-Minerals (EYE VITAMINS PO) Take by mouth.  ? Tamsulosin HCl (FLOMAX) 0.4 MG CAPS Take 0.4 mg by mouth daily.  ?  ? ?Allergies:   Amoxicillin  ? ?Social History  ? ?Socioeconomic History  ? Marital status: Married  ?  Spouse name: Not on file  ? Number of children: Not on file  ? Years of education: Not on file  ? Highest education level: Not on file  ?Occupational History  ? Occupation: mControl and instrumentation engineer ?Tobacco Use  ? Smoking status: Former  ?  Packs/day: 1.00  ?  Years: 10.00  ?  Pack years: 10.00  ?  Types: Cigarettes  ?  Quit date: 04/18/1968  ?  Years since quitting: 53.3  ? Smokeless tobacco: Never  ?Substance and Sexual Activity  ? Alcohol use: Yes  ?  Alcohol/week: 2.0 standard drinks  ?  Types: 2 Cans of beer per week  ? Drug use: No  ? Sexual activity: Not on file  ?Other Topics Concern  ? Not on file  ?Social History Narrative  ? Not on file  ? ?Social Determinants of Health  ? ?Financial Resource Strain: Not on file  ?Food Insecurity: Not on file  ?Transportation Needs: Not on file  ?Physical Activity: Not on file  ?Stress: Not on file  ?Social Connections: Not on file  ?  ? ?Family History: ?The patient's family history  includes Breast cancer in his sister; Heart attack in his father; Heart failure in his father; Uterine cancer in his sister. ? ?ROS:   ?Please see the history of present illness.  All other systems reviewed and are negative. ? ?Labs/Other Studies Reviewed:   ? ?The following studies were reviewed today: ? ?CT Chest 12/25/20 ? ?1. The appearance of the lungs is compatible with interstitial lung ?disease, with a spectrum of findings categorized as probable usual ?interstitial pneumonia (UIP) per current ATS guidelines. ?2. Aortic atherosclerosis, in addition to left main and 2 vessel ?coronary artery disease. Assessment for potential risk factor ?modification, dietary therapy or pharmacologic therapy may be ?warranted, if clinically indicated. ?  ?Aortic Atherosclerosis (ICD10-I70.0). ? ?CTA Chest Aorta 06/17/20 ? ?Maximum aortic diameter 4 cm at the aortic root compared to 4.2 cm ?previously. Recommend annual imaging followup by CTA or MRA. This ?recommendation follows 2010 ?ACCF/AHA/AATS/ACR/ASA/SCA/SCAI/SIR/STS/SVM Guidelines for the ?Diagnosis and Management of Patients with Thoracic Aortic Disease. ?Circulation. 2010; 121: T517-O160. Aortic aneurysm NOS (ICD10-I71.9) ?  ?Early changes of fibrosis peripherally in the lungs, stable since ?prior study. ?  ?Scattered coronary artery calcifications. ?  ?Aortic Atherosclerosis (ICD10-I70.0). ? ? ?CT Calcium Score 07/16/20 ? ?Coronary arteries: Normal origins. ?  ?Coronary Calcium Score: 234 ?  ?Left main: 0 ?  ?Left anterior descending artery: 234 ?  ?Left circumflex artery: 0 ?  ?Right coronary artery: 0 ?  ?Total: 234 ?  ?Percentile: 43rd ?  ?Pericardium: Normal. ?  ?Ascending Aorta: Dilated at the level of the PA bifurcation to 40 mm ?(non-contrast). Aortic atherosclerosis. ?  ?Coronary calcium score of 234. This was 43rd percentile for age-, ?race-, and sex-matched controls. ?  ?Mildly dilated aorta to 40 mm. ?  ?Dilated main PA suggestive of pulmonary hypertension. ?   ?Aortic atherosclerosis. ? ?Echo 06/17/19 ? ?Left Ventricle: Left ventricular ejection fraction, by estimation, is 60  ?to 65%. The left ventricle has normal function. The left ventricle has no  ?regional wall motion abnormalities. The left ventricular internal cavity  ?size was normal in size. There is  ?moderate asymmetric left ventricular hypertrophy. Left ventricular  ?diastolic parameters were normal.  ?Right Ventricle: The right ventricular size is mildly enlarged. No  ?increase in right ventricular wall thickness. Right ventricular systolic  ?function is normal. There is mildly elevated pulmonary artery systolic  ?pressure. The tricuspid regurgitant  ?velocity is 2.53 m/s, and with an assumed right atrial pressure of 8 mmHg,  ?the estimated right ventricular systolic pressure is 73.7 mmHg.  ?Left Atrium: Left atrial size was normal in size.  ?Right Atrium: Right atrial size was normal in size.  ?Pericardium: Trivial pericardial effusion is present.  ?Mitral Valve: The mitral valve is normal in structure and function.  ?Trivial mitral valve  regurgitation.  ?Tricuspid Valve: The tricuspid valve is normal in structure. Tricuspid  ?valve regurgitation is trivial.  ?Aortic Valve: The aortic valve is tricuspid. Aortic valve regurgitation is  ?mild. Aortic regurgitation PHT measures 958 msec. Mild aortic valve  ?sclerosis is present, with no evidence of aortic valve stenosis. Aortic  ?valve mean gradient measures 5.0 mmHg.  ?Aortic valve peak gradient measures 8.8 mmHg. Aortic valve area, by VTI  ?measures 2.69 cm?Marland Kitchen  ?Pulmonic Valve: The pulmonic valve was grossly normal. Pulmonic valve  ?regurgitation is not visualized.  ?Aorta: Aortic dilatation noted. There is dilatation of the aortic root  ?measuring 43 mm.  ?Venous: The inferior vena cava is dilated in size with greater than 50%  ?respiratory variability, suggesting right atrial pressure of 8 mmHg.  ?IAS/Shunts: The interatrial septum was not well visualized.   ? ?Recent Labs: ?09/22/2020: Hemoglobin 15.4; Platelets 214.0  ?Recent Lipid Panel ?   ?Component Value Date/Time  ? CHOL 143 07/23/2020 0828  ? TRIG 53 07/23/2020 0828  ? HDL 70 07/23/2020 0828  ? CHOLHDL 2.0 04/07

## 2021-08-10 ENCOUNTER — Ambulatory Visit: Payer: Medicare HMO | Admitting: Nurse Practitioner

## 2021-08-10 ENCOUNTER — Encounter: Payer: Self-pay | Admitting: Nurse Practitioner

## 2021-08-10 VITALS — BP 120/82 | HR 51 | Ht 71.0 in | Wt 225.0 lb

## 2021-08-10 DIAGNOSIS — I77810 Thoracic aortic ectasia: Secondary | ICD-10-CM | POA: Diagnosis not present

## 2021-08-10 DIAGNOSIS — I712 Thoracic aortic aneurysm, without rupture, unspecified: Secondary | ICD-10-CM | POA: Diagnosis not present

## 2021-08-10 DIAGNOSIS — I340 Nonrheumatic mitral (valve) insufficiency: Secondary | ICD-10-CM

## 2021-08-10 DIAGNOSIS — R001 Bradycardia, unspecified: Secondary | ICD-10-CM

## 2021-08-10 DIAGNOSIS — I700 Atherosclerosis of aorta: Secondary | ICD-10-CM | POA: Diagnosis not present

## 2021-08-10 DIAGNOSIS — I440 Atrioventricular block, first degree: Secondary | ICD-10-CM

## 2021-08-10 DIAGNOSIS — E785 Hyperlipidemia, unspecified: Secondary | ICD-10-CM | POA: Diagnosis not present

## 2021-08-10 DIAGNOSIS — R011 Cardiac murmur, unspecified: Secondary | ICD-10-CM

## 2021-08-10 LAB — COMPREHENSIVE METABOLIC PANEL
ALT: 14 IU/L (ref 0–44)
AST: 16 IU/L (ref 0–40)
Albumin/Globulin Ratio: 1.9 (ref 1.2–2.2)
Albumin: 4 g/dL (ref 3.7–4.7)
Alkaline Phosphatase: 68 IU/L (ref 44–121)
BUN/Creatinine Ratio: 13 (ref 10–24)
BUN: 9 mg/dL (ref 8–27)
Bilirubin Total: 0.9 mg/dL (ref 0.0–1.2)
CO2: 23 mmol/L (ref 20–29)
Calcium: 8.9 mg/dL (ref 8.6–10.2)
Chloride: 102 mmol/L (ref 96–106)
Creatinine, Ser: 0.7 mg/dL — ABNORMAL LOW (ref 0.76–1.27)
Globulin, Total: 2.1 g/dL (ref 1.5–4.5)
Glucose: 89 mg/dL (ref 70–99)
Potassium: 4.7 mmol/L (ref 3.5–5.2)
Sodium: 138 mmol/L (ref 134–144)
Total Protein: 6.1 g/dL (ref 6.0–8.5)
eGFR: 94 mL/min/{1.73_m2} (ref >59)

## 2021-08-10 LAB — LIPID PANEL
Chol/HDL Ratio: 1.7 ratio (ref 0.0–5.0)
Cholesterol, Total: 147 mg/dL (ref 100–199)
HDL: 88 mg/dL (ref >39)
LDL Chol Calc (NIH): 48 mg/dL (ref 0–99)
Triglycerides: 46 mg/dL (ref 0–149)
VLDL Cholesterol Cal: 11 mg/dL (ref 5–40)

## 2021-08-10 NOTE — Patient Instructions (Signed)
Medication Instructions:  ? ?Your physician recommends that you continue on your current medications as directed. Please refer to the Current Medication list given to you today. ? ? ?*If you need a refill on your cardiac medications before your next appointment, please call your pharmacy* ? ? ?Lab Work: ? ?TODAY!!!!  LIPID/CMET ? ? ?If you have labs (blood work) drawn today and your tests are completely normal, you will receive your results only by: ?MyChart Message (if you have MyChart) OR ?A paper copy in the mail ?If you have any lab test that is abnormal or we need to change your treatment, we will call you to review the results. ? ? ?Testing/Procedures: ? ?Your physician has requested that you have an echocardiogram. Echocardiography is a painless test that uses sound waves to create images of your heart. It provides your doctor with information about the size and shape of your heart and how well your heart?s chambers and valves are working. This procedure takes approximately one hour. There are no restrictions for this procedure. ? ? ? ?Follow-Up: ?At [facility], you and your health needs are our priority.  As part of our continuing mission to provide you with exceptional heart care, we have created designated Provider Care Teams.  These Care Teams include your primary Cardiologist (physician) and Advanced Practice Providers (APPs -  Physician Assistants and Nurse Practitioners) who all work together to provide you with the care you need, when you need it. ? ?We recommend signing up for the patient portal called "MyChart".  Sign up information is provided on this After Visit Summary.  MyChart is used to connect with patients for Virtual Visits (Telemedicine).  Patients are able to view lab/test results, encounter notes, upcoming appointments, etc.  Non-urgent messages can be sent to your provider as well.   ?To learn more about what you can do with MyChart, go to [email or web address].   ? ?Your next  appointment:   ?1 year(s) ? ?The format for your next appointment:   ?In Person ? ?Provider:   ?Fransico Him, MD   ? ? ?Other Instructions ? ?Your physician wants you to follow-up in: 1 year with Dr.Turner.  You will receive a reminder letter in the mail two months in advance. If you don't receive a letter, please call our office to schedule the follow-up appointment. ? ? ?Important Information About Sugar ? ? ? ? ?  ?

## 2021-08-11 ENCOUNTER — Ambulatory Visit
Admission: RE | Admit: 2021-08-11 | Discharge: 2021-08-11 | Disposition: A | Discharge status: Home / Self Care | Payer: Medicare HMO | Source: Ambulatory Visit | Attending: Urology | Admitting: Urology

## 2021-08-11 DIAGNOSIS — R972 Elevated prostate specific antigen [PSA]: Secondary | ICD-10-CM

## 2021-08-11 DIAGNOSIS — N400 Enlarged prostate without lower urinary tract symptoms: Secondary | ICD-10-CM | POA: Diagnosis not present

## 2021-08-11 DIAGNOSIS — R590 Localized enlarged lymph nodes: Secondary | ICD-10-CM | POA: Diagnosis not present

## 2021-08-11 DIAGNOSIS — Z8546 Personal history of malignant neoplasm of prostate: Secondary | ICD-10-CM

## 2021-08-11 MED ORDER — GADOBENATE DIMEGLUMINE 529 MG/ML IV SOLN
20.0000 mL | Freq: Once | INTRAVENOUS | Status: AC | PRN
  Administered 2021-08-11: 20 mL via INTRAVENOUS

## 2021-08-17 ENCOUNTER — Ambulatory Visit (HOSPITAL_COMMUNITY): Payer: Medicare HMO | Attending: Internal Medicine

## 2021-08-17 DIAGNOSIS — E785 Hyperlipidemia, unspecified: Secondary | ICD-10-CM

## 2021-08-17 DIAGNOSIS — I700 Atherosclerosis of aorta: Secondary | ICD-10-CM

## 2021-08-17 DIAGNOSIS — R011 Cardiac murmur, unspecified: Secondary | ICD-10-CM

## 2021-08-17 DIAGNOSIS — I712 Thoracic aortic aneurysm, without rupture, unspecified: Secondary | ICD-10-CM | POA: Diagnosis not present

## 2021-08-17 DIAGNOSIS — I77810 Thoracic aortic ectasia: Secondary | ICD-10-CM

## 2021-08-17 LAB — ECHOCARDIOGRAM COMPLETE
Area-P 1/2: 2.36 cm2
P 1/2 time: 615 msec
S' Lateral: 3.7 cm

## 2021-08-19 ENCOUNTER — Other Ambulatory Visit: Payer: Self-pay | Admitting: *Deleted

## 2021-08-19 DIAGNOSIS — I712 Thoracic aortic aneurysm, without rupture, unspecified: Secondary | ICD-10-CM

## 2021-08-20 DIAGNOSIS — R972 Elevated prostate specific antigen [PSA]: Secondary | ICD-10-CM | POA: Diagnosis not present

## 2021-08-20 DIAGNOSIS — C61 Malignant neoplasm of prostate: Secondary | ICD-10-CM | POA: Diagnosis not present

## 2021-09-06 ENCOUNTER — Ambulatory Visit (INDEPENDENT_AMBULATORY_CARE_PROVIDER_SITE_OTHER)
Admission: RE | Admit: 2021-09-06 | Discharge: 2021-09-06 | Disposition: A | Discharge status: Home / Self Care | Payer: Medicare HMO | Source: Ambulatory Visit | Attending: Nurse Practitioner | Admitting: Nurse Practitioner

## 2021-09-06 DIAGNOSIS — I700 Atherosclerosis of aorta: Secondary | ICD-10-CM | POA: Diagnosis not present

## 2021-09-06 DIAGNOSIS — I712 Thoracic aortic aneurysm, without rupture, unspecified: Secondary | ICD-10-CM

## 2021-09-06 MED ORDER — IOHEXOL 350 MG/ML SOLN
100.0000 mL | Freq: Once | INTRAVENOUS | Status: AC | PRN
  Administered 2021-09-06: 100 mL via INTRAVENOUS

## 2021-10-05 DIAGNOSIS — N400 Enlarged prostate without lower urinary tract symptoms: Secondary | ICD-10-CM | POA: Diagnosis not present

## 2021-10-05 DIAGNOSIS — I10 Essential (primary) hypertension: Secondary | ICD-10-CM | POA: Diagnosis not present

## 2021-10-05 DIAGNOSIS — K219 Gastro-esophageal reflux disease without esophagitis: Secondary | ICD-10-CM | POA: Diagnosis not present

## 2021-10-05 DIAGNOSIS — E785 Hyperlipidemia, unspecified: Secondary | ICD-10-CM | POA: Diagnosis not present

## 2021-10-13 ENCOUNTER — Other Ambulatory Visit: Payer: Self-pay | Admitting: Cardiology

## 2021-10-13 NOTE — Telephone Encounter (Signed)
Pt's pharmacy is requesting a refill on esomeprazole. Would Dr. Radford Pax like to refill this medication? Please address

## 2021-11-03 ENCOUNTER — Other Ambulatory Visit: Payer: Self-pay | Admitting: Cardiology

## 2021-11-03 DIAGNOSIS — E7800 Pure hypercholesterolemia, unspecified: Secondary | ICD-10-CM

## 2021-11-03 NOTE — Telephone Encounter (Signed)
Pt's pharmacy is requesting a refill on esomeprazole. Would Dr. Radford Pax like to refill this medication? Please address

## 2021-11-11 DIAGNOSIS — C61 Malignant neoplasm of prostate: Secondary | ICD-10-CM | POA: Diagnosis not present

## 2021-11-18 DIAGNOSIS — C61 Malignant neoplasm of prostate: Secondary | ICD-10-CM | POA: Diagnosis not present

## 2021-11-23 DIAGNOSIS — J1189 Influenza due to unidentified influenza virus with other manifestations: Secondary | ICD-10-CM | POA: Diagnosis not present

## 2021-12-01 DIAGNOSIS — H524 Presbyopia: Secondary | ICD-10-CM | POA: Diagnosis not present

## 2021-12-01 DIAGNOSIS — H52223 Regular astigmatism, bilateral: Secondary | ICD-10-CM | POA: Diagnosis not present

## 2022-01-03 ENCOUNTER — Other Ambulatory Visit: Payer: Self-pay | Admitting: Cardiology

## 2022-01-22 DIAGNOSIS — R051 Acute cough: Secondary | ICD-10-CM | POA: Diagnosis not present

## 2022-01-22 DIAGNOSIS — J189 Pneumonia, unspecified organism: Secondary | ICD-10-CM | POA: Diagnosis not present

## 2022-02-03 DIAGNOSIS — J189 Pneumonia, unspecified organism: Secondary | ICD-10-CM | POA: Diagnosis not present

## 2022-02-03 DIAGNOSIS — R058 Other specified cough: Secondary | ICD-10-CM | POA: Diagnosis not present

## 2022-02-03 DIAGNOSIS — Z6830 Body mass index (BMI) 30.0-30.9, adult: Secondary | ICD-10-CM | POA: Diagnosis not present

## 2022-02-08 DIAGNOSIS — H353132 Nonexudative age-related macular degeneration, bilateral, intermediate dry stage: Secondary | ICD-10-CM | POA: Diagnosis not present

## 2022-02-08 DIAGNOSIS — H35372 Puckering of macula, left eye: Secondary | ICD-10-CM | POA: Diagnosis not present

## 2022-02-08 DIAGNOSIS — H524 Presbyopia: Secondary | ICD-10-CM | POA: Diagnosis not present

## 2022-02-08 DIAGNOSIS — Z961 Presence of intraocular lens: Secondary | ICD-10-CM | POA: Diagnosis not present

## 2022-02-08 DIAGNOSIS — H52203 Unspecified astigmatism, bilateral: Secondary | ICD-10-CM | POA: Diagnosis not present

## 2022-02-08 DIAGNOSIS — H43813 Vitreous degeneration, bilateral: Secondary | ICD-10-CM | POA: Diagnosis not present

## 2022-02-21 DIAGNOSIS — Z23 Encounter for immunization: Secondary | ICD-10-CM | POA: Diagnosis not present

## 2022-02-21 DIAGNOSIS — Z0000 Encounter for general adult medical examination without abnormal findings: Secondary | ICD-10-CM | POA: Diagnosis not present

## 2022-02-21 DIAGNOSIS — Z6830 Body mass index (BMI) 30.0-30.9, adult: Secondary | ICD-10-CM | POA: Diagnosis not present

## 2022-02-21 DIAGNOSIS — Z1389 Encounter for screening for other disorder: Secondary | ICD-10-CM | POA: Diagnosis not present

## 2022-03-03 DIAGNOSIS — I10 Essential (primary) hypertension: Secondary | ICD-10-CM | POA: Diagnosis not present

## 2022-03-03 DIAGNOSIS — E7800 Pure hypercholesterolemia, unspecified: Secondary | ICD-10-CM | POA: Diagnosis not present

## 2022-03-11 ENCOUNTER — Other Ambulatory Visit: Payer: Self-pay | Admitting: Cardiology

## 2022-04-09 DIAGNOSIS — E785 Hyperlipidemia, unspecified: Secondary | ICD-10-CM | POA: Diagnosis not present

## 2022-04-09 DIAGNOSIS — I10 Essential (primary) hypertension: Secondary | ICD-10-CM | POA: Diagnosis not present

## 2022-04-09 DIAGNOSIS — K219 Gastro-esophageal reflux disease without esophagitis: Secondary | ICD-10-CM | POA: Diagnosis not present

## 2022-05-24 DIAGNOSIS — C61 Malignant neoplasm of prostate: Secondary | ICD-10-CM | POA: Diagnosis not present

## 2022-07-13 ENCOUNTER — Telehealth: Payer: Self-pay

## 2022-07-13 NOTE — Telephone Encounter (Signed)
**Note De-Identified Persephone Schriever Obfuscation** Romie Jumper (Key: [id number]) PA Case ID #: [id number] Rx #: [id number] Outcome: Approved today Your request has been approved Authorization Expiration Date: 04/18/2023 Drug Esomeprazole Magnesium 40MG  dr capsules ePA cloud logo Form [employer] Electronic PA Form [phone] NCPDP) Original Claim Info  I have notified Kayla at [place], [place] - [address] (Ph: [phone]) of this approval.

## 2022-08-02 ENCOUNTER — Other Ambulatory Visit: Payer: Self-pay | Admitting: Cardiology

## 2022-08-08 ENCOUNTER — Other Ambulatory Visit: Payer: Self-pay | Admitting: Cardiology

## 2022-08-08 DIAGNOSIS — E7800 Pure hypercholesterolemia, unspecified: Secondary | ICD-10-CM

## 2022-08-19 IMAGING — CT CT ANGIO CHEST
3 of 8 series · 18 of 46 positions shown · IV contrast (OMNIPAQUE 350)
Comparison: December 25, 2020.

CLINICAL DATA: Thoracic aortic aneurysm.

EXAM:
CT ANGIOGRAPHY CHEST WITH CONTRAST
TECHNIQUE: Multidetector CT imaging of the chest was performed using the
standard protocol during bolus administration of intravenous
contrast. Multiplanar CT image reconstructions and MIPs were
obtained to evaluate the vascular anatomy.

[Series 4: aorta 3.0 bf37 2 · axial · 0.79mm/px · z∈[-304,-40]mm · 13 of 104 slices shown]
[im 8/104  lung]
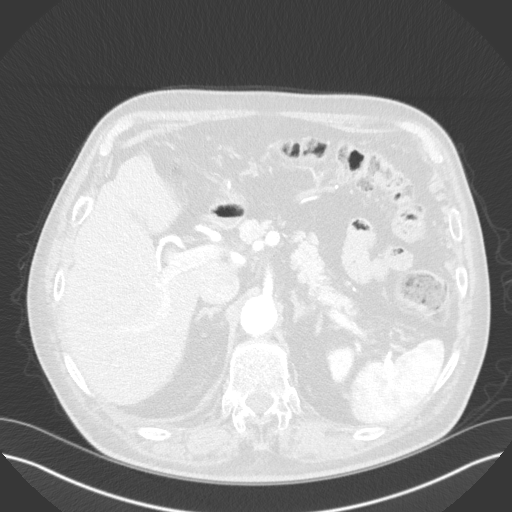
[im 15/104  soft-tissue]
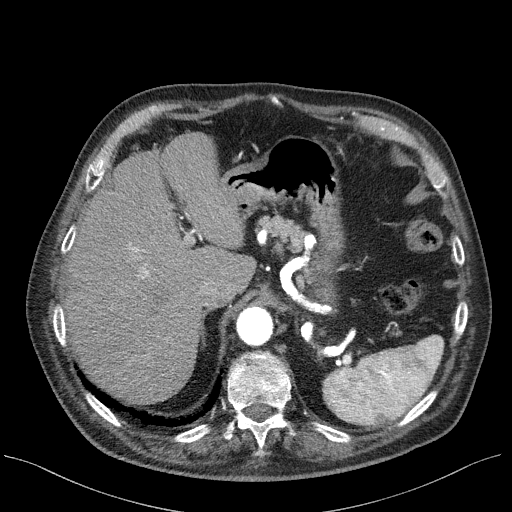
[im 23/104  lung]
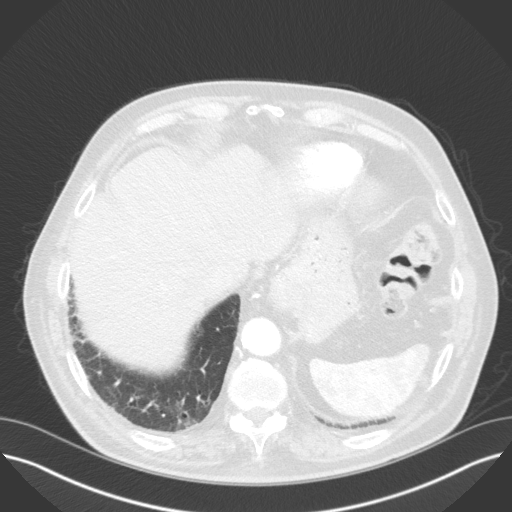
[im 30/104  soft-tissue]
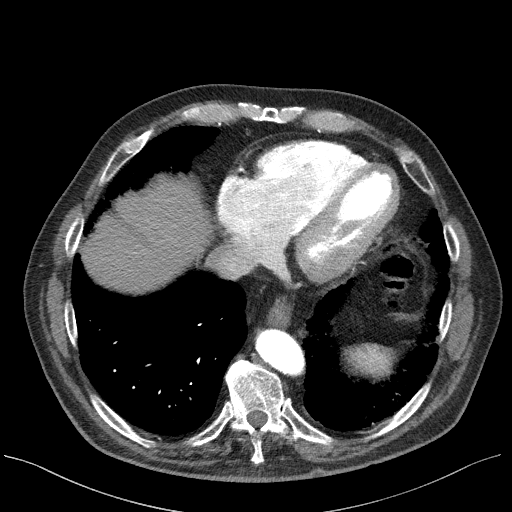
[im 37/104  lung]
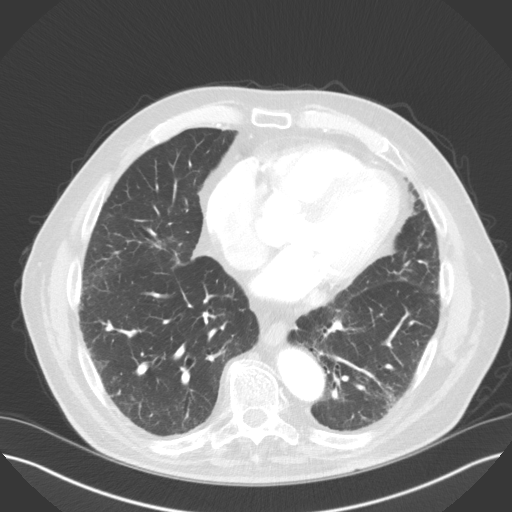
[im 45/104  soft-tissue]
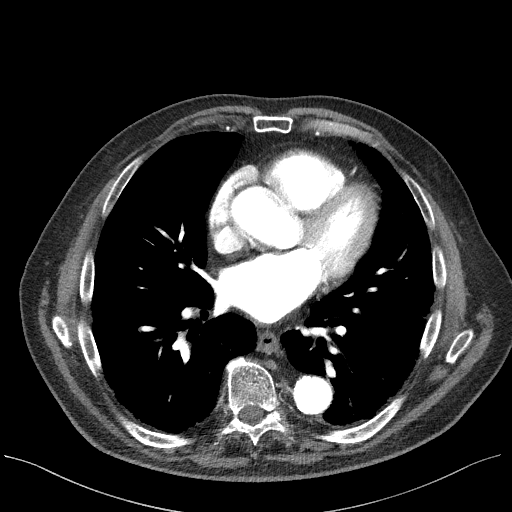
[im 52/104  lung]
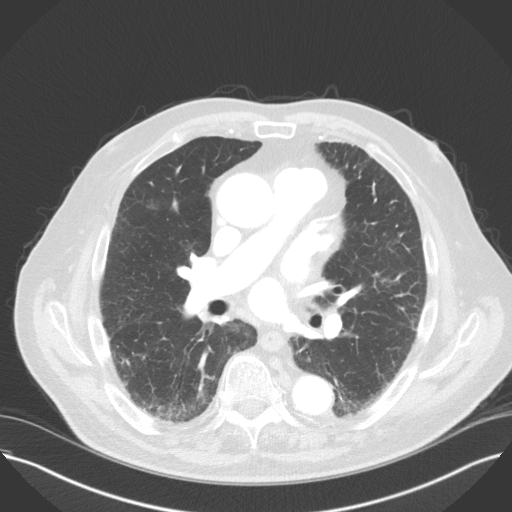
[im 59/104  soft-tissue]
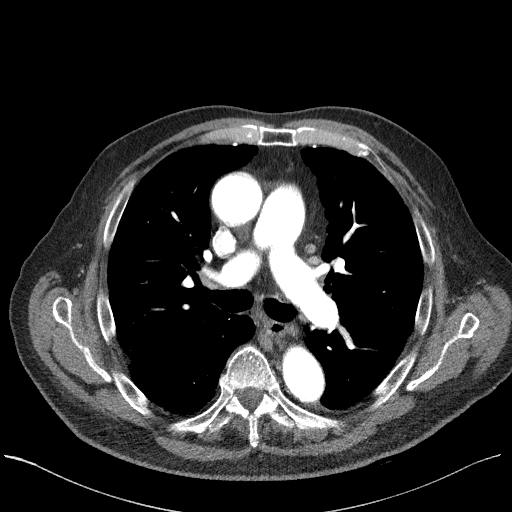
[im 67/104  lung]
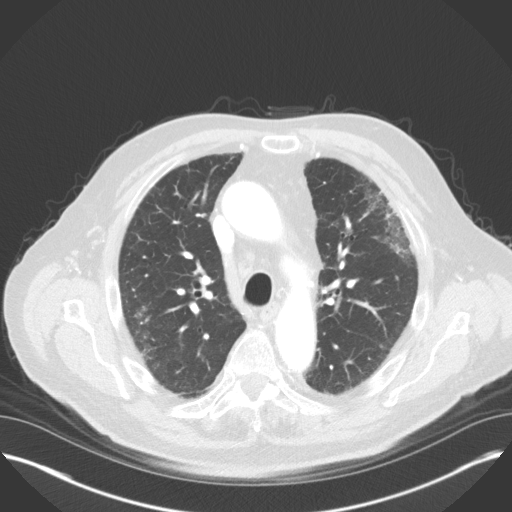
[im 74/104  soft-tissue]
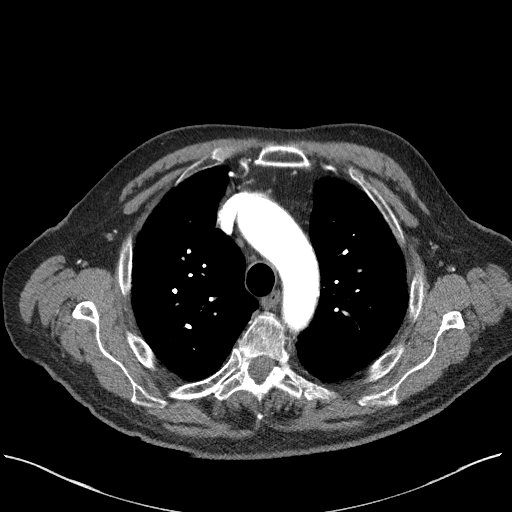
[im 81/104  lung]
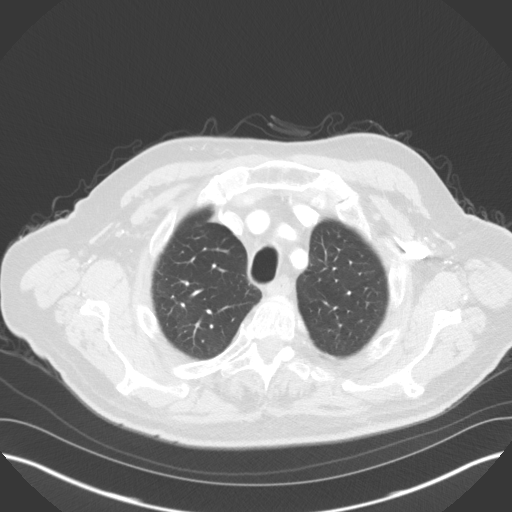
[im 89/104  soft-tissue]
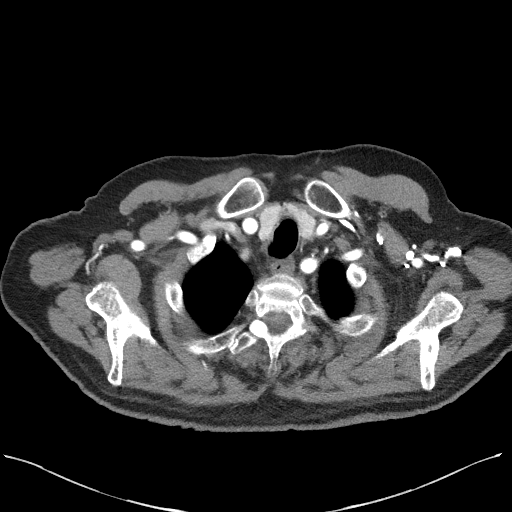
[im 96/104  lung]
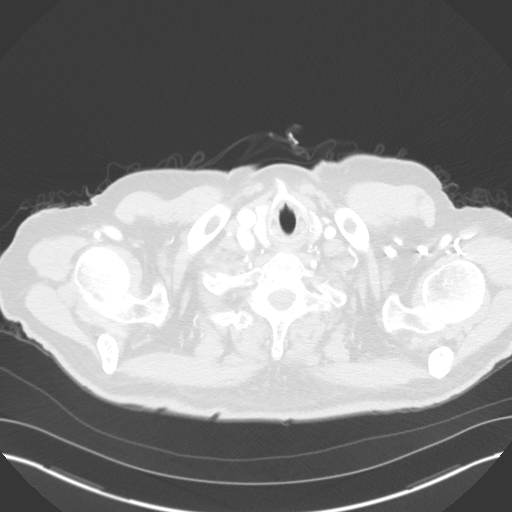

[Series 5: lung · axial · 0.79mm/px · z∈[-304,-258]mm · 2 of 104 slices shown]
[im 8/104  soft-tissue]
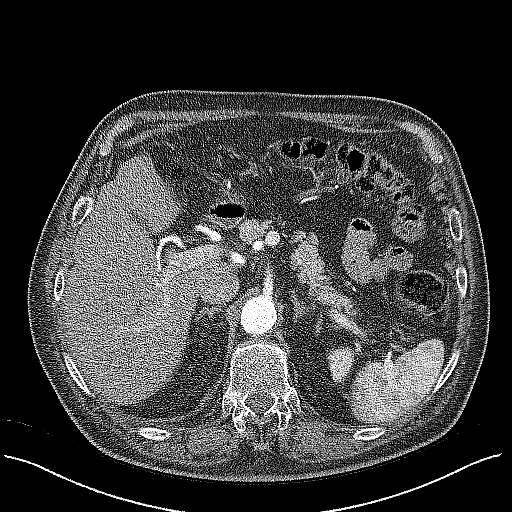
[im 23/104  soft-tissue]
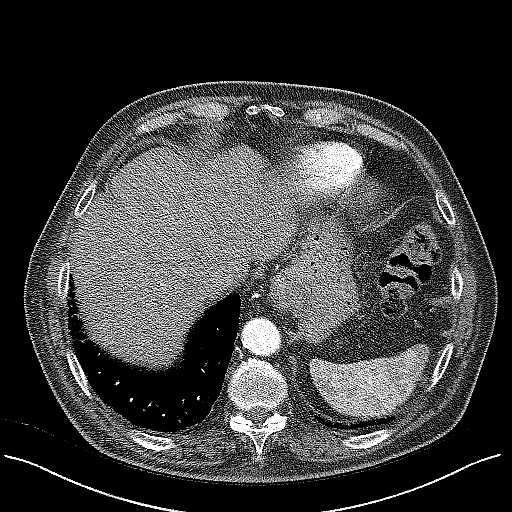

[Series 7: coronals · coronal · 0.63mm/px · 3 of 144 slices shown]
[im 36/144  soft-tissue]
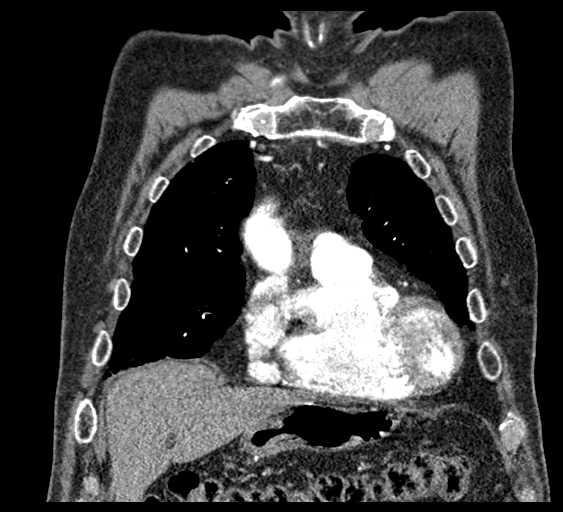
[im 72/144  soft-tissue]
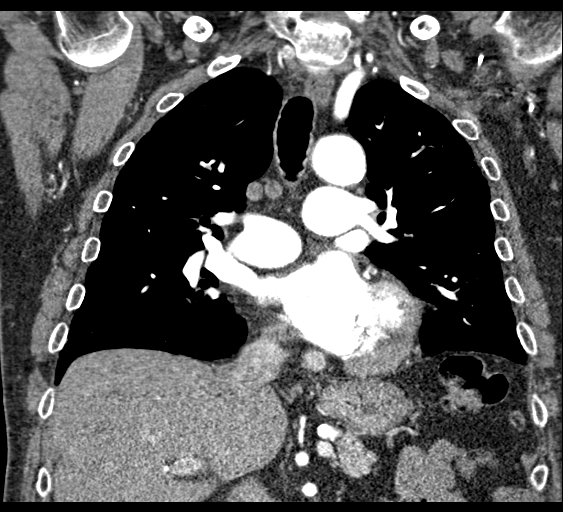
[im 108/144  soft-tissue]
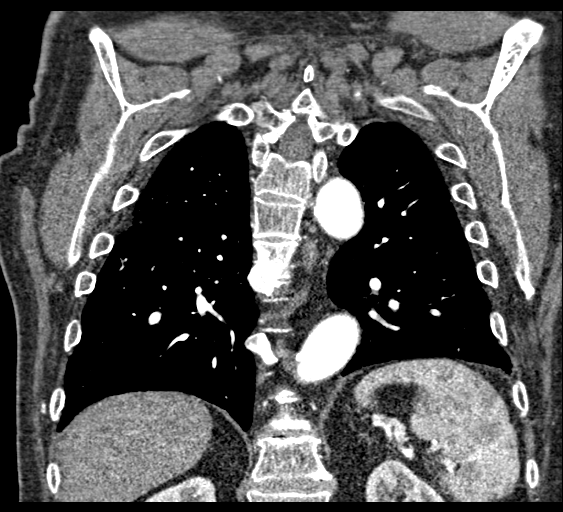

[18 of 46 positions shown; findings below may reference images not displayed]

RADIATION DOSE REDUCTION: This exam was performed according to the
departmental dose-optimization program which includes automated
exposure control, adjustment of the mA and/or kV according to
patient size and/or use of iterative reconstruction technique.

CONTRAST:  100mL OMNIPAQUE IOHEXOL 350 MG/ML SOLN
FINDINGS: Cardiovascular: Atherosclerosis of thoracic aorta is noted without
dissection. Great vessels are widely patent without significant
stenosis. Ascending thoracic aorta measures 3.9 cm. Transverse
thoracic aorta measures 3.2 cm. Proximal descending thoracic aorta
measures 3.2 cm. Aortic root is dilated at 4.3 cm. Normal cardiac
size. No pericardial effusion.

Mediastinum/Nodes: No enlarged mediastinal, hilar, or axillary lymph
nodes. Thyroid gland, trachea, and esophagus demonstrate no
significant findings.

Lungs/Pleura: No pneumothorax or pleural effusion is noted. Stable
reticular densities are noted throughout both lungs consistent with
chronic interstitial lung disease or scarring. Stable 5 mm nodule is
noted in right middle lobe seen on image number 70 of series 5,
which can be considered benign at this point with no further
follow-up required.

Upper Abdomen: No acute abnormality.

Musculoskeletal: No chest wall abnormality. No acute or significant
osseous findings.

Review of the MIP images confirms the above findings.
IMPRESSION: Grossly stable aortic root dilatation is noted at 4.3 cm.

Stable reticular densities are noted throughout both lungs
consistent with chronic interstitial lung disease or scarring.

Aortic Atherosclerosis (VIQC1-YQL.L).

## 2022-09-07 ENCOUNTER — Other Ambulatory Visit: Payer: Self-pay | Admitting: Cardiology

## 2022-09-07 DIAGNOSIS — E7800 Pure hypercholesterolemia, unspecified: Secondary | ICD-10-CM

## 2022-09-07 NOTE — Telephone Encounter (Signed)
Please keep upcoming  appointment (10/24)for future refills. Thank you.2nd attempt

## 2022-09-27 ENCOUNTER — Other Ambulatory Visit: Payer: Self-pay | Admitting: Urology

## 2022-09-27 DIAGNOSIS — C61 Malignant neoplasm of prostate: Secondary | ICD-10-CM

## 2022-11-07 ENCOUNTER — Other Ambulatory Visit: Payer: Self-pay | Admitting: Cardiology

## 2022-11-29 ENCOUNTER — Other Ambulatory Visit: Payer: Self-pay | Admitting: Cardiology

## 2022-12-01 ENCOUNTER — Encounter: Payer: Self-pay | Admitting: Urology

## 2022-12-02 ENCOUNTER — Encounter: Payer: Self-pay | Admitting: Urology

## 2022-12-02 NOTE — Telephone Encounter (Signed)
Called patient to advise that he needs to contact PCP about refilling this med. Patient states he has an upcoming appt with his PCP and has enough medication to last him until that time.

## 2022-12-02 NOTE — Telephone Encounter (Signed)
**Note De-Identified Stephen [name] Obfuscation** Yes, Esomeprazole was ordered at the pts Video Visit on 05/26/20 with Dr Mayford Knife as follows: Orders Placed   Comprehensive metabolic panel  Lipid panel All Encounter Results Medication Changes   Esomeprazole Magnesium 40 mg Oral 2 times daily

## 2022-12-06 ENCOUNTER — Ambulatory Visit
Admission: RE | Admit: 2022-12-06 | Discharge: 2022-12-06 | Disposition: A | Discharge status: Home / Self Care | Payer: Medicare HMO | Source: Ambulatory Visit | Attending: Urology | Admitting: Urology

## 2022-12-06 DIAGNOSIS — C61 Malignant neoplasm of prostate: Secondary | ICD-10-CM | POA: Diagnosis not present

## 2022-12-06 MED ORDER — GADOPICLENOL 0.5 MMOL/ML IV SOLN
10.0000 mL | Freq: Once | INTRAVENOUS | Status: AC | PRN
  Administered 2022-12-06: 10 mL via INTRAVENOUS

## 2022-12-15 DIAGNOSIS — C61 Malignant neoplasm of prostate: Secondary | ICD-10-CM | POA: Diagnosis not present

## 2023-01-25 ENCOUNTER — Encounter: Payer: Self-pay | Admitting: Cardiology

## 2023-01-25 ENCOUNTER — Ambulatory Visit: Payer: Medicare HMO | Attending: Cardiology | Admitting: Cardiology

## 2023-01-25 VITALS — BP 124/62 | HR 58 | Ht 71.0 in | Wt 222.0 lb

## 2023-01-25 DIAGNOSIS — Z09 Encounter for follow-up examination after completed treatment for conditions other than malignant neoplasm: Secondary | ICD-10-CM

## 2023-01-25 DIAGNOSIS — Q2381 Bicuspid aortic valve: Secondary | ICD-10-CM

## 2023-01-25 DIAGNOSIS — I77810 Thoracic aortic ectasia: Secondary | ICD-10-CM

## 2023-01-25 DIAGNOSIS — I10 Essential (primary) hypertension: Secondary | ICD-10-CM | POA: Diagnosis not present

## 2023-01-25 DIAGNOSIS — E785 Hyperlipidemia, unspecified: Secondary | ICD-10-CM

## 2023-01-25 DIAGNOSIS — Z01812 Encounter for preprocedural laboratory examination: Secondary | ICD-10-CM | POA: Diagnosis not present

## 2023-01-25 MED ORDER — METOPROLOL TARTRATE 25 MG PO TABS: 25.0000 mg | ORAL_TABLET | Freq: Once | ORAL | 0 refills | Status: DC

## 2023-01-25 NOTE — Progress Notes (Signed)
Date:  01/25/2023   ID:  Stephen [name], DOB [birth year], MRN [id number]  PCP:  Catha Gosselin, MD Cardiologist:  Armanda Magic, MD  Electrophysiologist:  None   Chief Complaint:  BAV, HTN, dilated aorta  History of Present Illness:    Stephen [name] is a 82 y.o. male with a hx of HTN, bicuspid AV and dilated aorta. He is here today for followup and is doing well.  He is here today for followup and is doing well.  He denies any chest pain or pressure, SOB, DOE, PND, orthopnea, LE edema, dizziness, palpitations or syncope. He is compliant with his meds and is tolerating meds with no SE.    Prior CV studies:   The following studies were reviewed today:  Outside labs from PCP from [facility]  2D echo 06/2019 IMPRESSIONS     1. Left ventricular ejection fraction, by estimation, is 60 to 65%. The  left ventricle has normal function. The left ventricle has no regional  wall motion abnormalities. There is moderate asymmetric left ventricular  hypertrophy. Left ventricular  diastolic parameters were normal.   2. Moderate asymmetric hypertrophy measuring 14mm in basal septum (9mm in  posterior wall)   3. Right ventricular systolic function is normal. The right ventricular  size is mildly enlarged. There is mildly elevated pulmonary artery  systolic pressure.   4. The mitral valve is normal in structure and function. Trivial mitral  valve regurgitation.   5. The aortic valve is tricuspid. Aortic valve regurgitation is mild.  Mild aortic valve sclerosis is present, with no evidence of aortic valve  stenosis.   6. Aortic dilatation noted. There is dilatation of the aortic root  measuring 43 mm. There is dilation of the ascending aorta measuring 42mm.  Dilation of the aortic arch measuring 40mm   Past Medical History:  Diagnosis Date   Adenomatous colon polyp    Agatston coronary artery calcium score between 200 and 399    coronary Ca score 234 by CT 06/2020   Allergic rhinitis    Aortic  atherosclerosis (HCC)    noted on chest CT 06/2020   Aortic dilatation ([facility])    40mm by chest CTA 06/2020   Bicuspid aortic valve    HLD (hyperlipidemia)    Hypertension    Past Surgical History:  Procedure Laterality Date   COLONOSCOPY W/ POLYPECTOMY     TONSILLECTOMY     UMBILICAL HERNIA REPAIR     VASECTOMY       Current Meds  Medication Sig   amLODipine (NORVASC) 5 MG tablet Take 1 tablet (5 mg total) by mouth daily. Please keep October 2024 appt for future refills. Thank you   aspirin 81 MG tablet Take 81 mg by mouth daily.   atorvastatin (LIPITOR) 10 MG tablet TAKE ONE TABLET BY MOUTH ONCE DAILY Needs appointment for further refills   esomeprazole (NEXIUM) 40 MG capsule TAKE ONE CAPSULE BY MOUTH every morning and TAKE ONE CAPSULE BY MOUTH AT bedtime   loratadine (CLARITIN) 10 MG tablet Take 10 mg by mouth daily as needed.    losartan (COZAAR) 100 MG tablet Take 100 mg by mouth daily.   Multiple Vitamin (MULTIVITAMIN) capsule Take 1 capsule by mouth daily.   Multiple Vitamins-Minerals (EYE VITAMINS PO) Take by mouth.   Tamsulosin HCl (FLOMAX) 0.4 MG CAPS Take 0.4 mg by mouth daily.     Allergies:   Amoxicillin   Social History   Tobacco Use   Smoking status: Former  Current packs/day: 0.00    Average packs/day: 1 pack/day for 10.0 years (10.0 ttl pk-yrs)    Types: Cigarettes    Start date: 04/18/1958    Quit date: 04/18/1968    Years since quitting: 54.8   Smokeless tobacco: Never  Substance Use Topics   Alcohol use: Yes    Alcohol/week: 2.0 standard drinks of alcohol    Types: 2 Cans of beer per week   Drug use: No     Family Hx: The patient's family history includes Breast cancer in his sister; Heart attack in his father; Heart failure in his father; Uterine cancer in his sister.  ROS:   Please see the history of present illness.     All other systems reviewed and are negative.   Labs/Other Tests and Data Reviewed:    Recent Labs: No results found for  requested labs within last 365 days.   Recent Lipid Panel Lab Results  Component Value Date/Time   CHOL 147 08/10/2021 08:36 AM   TRIG 46 08/10/2021 08:36 AM   HDL 88 08/10/2021 08:36 AM   CHOLHDL 1.7 08/10/2021 08:36 AM   LDLCALC 48 08/10/2021 08:36 AM    Wt Readings from Last 3 Encounters:  01/25/23 222 lb (100.7 kg)  08/10/21 225 lb (102.1 kg)  03/03/21 274 lb 12.8 oz (124.6 kg)     Objective:    Vital Signs:  BP 124/62   Pulse (!) 58   Ht 5\' 11"  (1.803 m)   Wt 222 lb (100.7 kg)   SpO2 97%   BMI 30.96 kg/m    GEN: Well nourished, well developed in no acute distress HEENT: Normal NECK: No JVD; No carotid bruits LYMPHATICS: No lymphadenopathy CARDIAC:RRR, no murmurs, rubs, gallops RESPIRATORY:  Clear to auscultation without rales, wheezing or rhonchi  ABDOMEN: Soft, non-tender, non-distended MUSCULOSKELETAL:  No edema; No deformity  SKIN: Warm and dry NEUROLOGIC:  Alert and oriented x 3 PSYCHIATRIC:  Normal affect  ASSESSMENT & PLAN:    1.  Bicuspid AV -2D echo 06/2019 showed stable BAV with mild AI -Repeat 2D echo  2.  HTN -BP is adequately controlled on exam today -Continue prescription drug managed with amlodipine 5 mg daily and losartan 100 mg daily with as needed refills -Check BMP  3. Ascending aorta dilatation -2D echo 06/2019 showed mildly dilated aortic root at 43mm -Chest CTA 09/04/2021 showed 43 mm aortic root -BP remains adequately controlled  -Repeat chest CT to make sure this is stable  4. HLD -LDL goal < 70 -Continue-prescription drug management with atorvastatin 10 mg daily as needed refills -Check FLP and ALT  5.  Abnormal EKG -EKG today and new small Q waves in the inferior leads -will check 2D echo to look for focal wall motion abnormality    Medication Adjustments/Labs and Tests Ordered: Current medicines are reviewed at length with the patient today.  Concerns regarding medicines are outlined above.  Tests Ordered: Orders Placed  This Encounter  Procedures   EKG 12-Lead   Medication Changes: No orders of the defined types were placed in this encounter.   Disposition:  Follow up in 1 year(s)  Signed, Armanda Magic, MD  01/25/2023 9:11 AM    [facility]

## 2023-01-25 NOTE — Patient Instructions (Addendum)
Medication Instructions:  Your physician recommends that you continue on your current medications as directed. Please refer to the Current Medication list given to you today.  *If you need a refill on your cardiac medications before your next appointment, please call your pharmacy*   Lab Work: Please complete a Fasting lipid panel and a CMET in our lab today before you leave.   If you have labs (blood work) drawn today and your tests are completely normal, you will receive your results only by: MyChart Message (if you have MyChart) OR A paper copy in the mail If you have any lab test that is abnormal or we need to change your treatment, we will call you to review the results.   Testing/Procedures: Your physician has requested that you have an echocardiogram. Echocardiography is a painless test that uses sound waves to create images of your heart. It provides your doctor with information about the size and shape of your heart and how well your heart's chambers and valves are working. This procedure takes approximately one hour. There are no restrictions for this procedure. Please do NOT wear cologne, perfume, aftershave, or lotions (deodorant is allowed). Please arrive 15 minutes prior to your appointment time.  Your physician has ordered a chest CTA to evaluate your aorta.    Follow-Up: At [facility], you and your health needs are our priority.  As part of our continuing mission to provide you with exceptional heart care, we have created designated Provider Care Teams.  These Care Teams include your primary Cardiologist (physician) and Advanced Practice Providers (APPs -  Physician Assistants and Nurse Practitioners) who all work together to provide you with the care you need, when you need it.  We recommend signing up for the patient portal called "MyChart".  Sign up information is provided on this After Visit Summary.  MyChart is used to connect with patients for Virtual Visits  (Telemedicine).  Patients are able to view lab/test results, encounter notes, upcoming appointments, etc.  Non-urgent messages can be sent to your provider as well.   To learn more about what you can do with MyChart, go to [email or web address].    Your next appointment:   1 year(s)  Provider:   Armanda Magic, MD

## 2023-01-25 NOTE — Addendum Note (Signed)
Addended by: Luellen Pucker on: 01/25/2023 09:48 AM   Modules accepted: Orders

## 2023-01-25 NOTE — Addendum Note (Signed)
Addended by: Luellen Pucker on: 01/25/2023 09:32 AM   Modules accepted: Orders

## 2023-01-26 LAB — LIPID PANEL
Chol/HDL Ratio: 1.6 {ratio} (ref 0.0–5.0)
Cholesterol, Total: 146 mg/dL (ref 100–199)
HDL: 90 mg/dL (ref >39)
LDL Chol Calc (NIH): 45 mg/dL (ref 0–99)
Triglycerides: 47 mg/dL (ref 0–149)
VLDL Cholesterol Cal: 11 mg/dL (ref 5–40)

## 2023-01-26 LAB — COMPREHENSIVE METABOLIC PANEL
ALT: 15 [IU]/L (ref 0–44)
AST: 20 [IU]/L (ref 0–40)
Albumin: 4.2 g/dL (ref 3.7–4.7)
Alkaline Phosphatase: 80 [IU]/L (ref 44–121)
BUN/Creatinine Ratio: 9 — ABNORMAL LOW (ref 10–24)
BUN: 7 mg/dL — ABNORMAL LOW (ref 8–27)
Bilirubin Total: 1.1 mg/dL (ref 0.0–1.2)
CO2: 26 mmol/L (ref 20–29)
Calcium: 9.1 mg/dL (ref 8.6–10.2)
Chloride: 103 mmol/L (ref 96–106)
Creatinine, Ser: 0.75 mg/dL — ABNORMAL LOW (ref 0.76–1.27)
Globulin, Total: 2.3 g/dL (ref 1.5–4.5)
Glucose: 88 mg/dL (ref 70–99)
Potassium: 4.6 mmol/L (ref 3.5–5.2)
Sodium: 141 mmol/L (ref 134–144)
Total Protein: 6.5 g/dL (ref 6.0–8.5)
eGFR: 91 mL/min/{1.73_m2} (ref >59)

## 2023-02-15 DIAGNOSIS — H35372 Puckering of macula, left eye: Secondary | ICD-10-CM | POA: Diagnosis not present

## 2023-02-15 DIAGNOSIS — H353132 Nonexudative age-related macular degeneration, bilateral, intermediate dry stage: Secondary | ICD-10-CM | POA: Diagnosis not present

## 2023-02-15 DIAGNOSIS — H43813 Vitreous degeneration, bilateral: Secondary | ICD-10-CM | POA: Diagnosis not present

## 2023-02-15 DIAGNOSIS — H52203 Unspecified astigmatism, bilateral: Secondary | ICD-10-CM | POA: Diagnosis not present

## 2023-02-15 DIAGNOSIS — Z961 Presence of intraocular lens: Secondary | ICD-10-CM | POA: Diagnosis not present

## 2023-02-16 DIAGNOSIS — C61 Malignant neoplasm of prostate: Secondary | ICD-10-CM | POA: Diagnosis not present

## 2023-02-23 ENCOUNTER — Ambulatory Visit (HOSPITAL_COMMUNITY): Payer: Medicare HMO | Attending: Internal Medicine

## 2023-02-23 DIAGNOSIS — I351 Nonrheumatic aortic (valve) insufficiency: Secondary | ICD-10-CM

## 2023-02-23 DIAGNOSIS — Q2381 Bicuspid aortic valve: Secondary | ICD-10-CM | POA: Diagnosis not present

## 2023-02-23 LAB — ECHOCARDIOGRAM COMPLETE
Area-P 1/2: 2.3 cm2
P 1/2 time: 685 ms
S' Lateral: 2.8 cm

## 2023-02-27 ENCOUNTER — Ambulatory Visit (HOSPITAL_BASED_OUTPATIENT_CLINIC_OR_DEPARTMENT_OTHER)
Admission: RE | Admit: 2023-02-27 | Discharge: 2023-02-27 | Disposition: A | Discharge status: Home / Self Care | Payer: Medicare HMO | Source: Ambulatory Visit | Attending: Cardiology | Admitting: Cardiology

## 2023-02-27 ENCOUNTER — Encounter (HOSPITAL_BASED_OUTPATIENT_CLINIC_OR_DEPARTMENT_OTHER): Payer: Self-pay

## 2023-02-27 DIAGNOSIS — I2510 Atherosclerotic heart disease of native coronary artery without angina pectoris: Secondary | ICD-10-CM | POA: Diagnosis not present

## 2023-02-27 DIAGNOSIS — I77810 Thoracic aortic ectasia: Secondary | ICD-10-CM | POA: Insufficient documentation

## 2023-02-27 DIAGNOSIS — J479 Bronchiectasis, uncomplicated: Secondary | ICD-10-CM | POA: Diagnosis not present

## 2023-02-27 DIAGNOSIS — J849 Interstitial pulmonary disease, unspecified: Secondary | ICD-10-CM | POA: Diagnosis not present

## 2023-02-27 DIAGNOSIS — I7121 Aneurysm of the ascending aorta, without rupture: Secondary | ICD-10-CM | POA: Diagnosis not present

## 2023-02-27 MED ORDER — IOHEXOL 350 MG/ML SOLN
100.0000 mL | Freq: Once | INTRAVENOUS | Status: AC | PRN
  Administered 2023-02-27: 100 mL via INTRAVENOUS

## 2023-02-28 ENCOUNTER — Encounter: Payer: Self-pay | Admitting: Cardiology

## 2023-03-06 ENCOUNTER — Telehealth: Payer: Self-pay

## 2023-03-06 ENCOUNTER — Encounter: Payer: Self-pay | Admitting: Cardiology

## 2023-03-06 DIAGNOSIS — M1712 Unilateral primary osteoarthritis, left knee: Secondary | ICD-10-CM | POA: Diagnosis not present

## 2023-03-06 DIAGNOSIS — I77810 Thoracic aortic ectasia: Secondary | ICD-10-CM

## 2023-03-06 DIAGNOSIS — M1711 Unilateral primary osteoarthritis, right knee: Secondary | ICD-10-CM | POA: Diagnosis not present

## 2023-03-06 NOTE — Telephone Encounter (Signed)
-----   Message from Armanda Magic sent at 02/28/2023  5:29 PM EST ----- Echo showed normal pumping function of the heart muscle with mildly thickened heart muscle and increased stiffness of the heart muscle called diastolic dysfunction.  There is mild leakiness of the aortic valve with some mild calcification of the aortic valve.  The aortic root is mildly enlarged and ascending aorta is moderately enlarged.  Chest CTA was done yesterday and results pending

## 2023-03-06 NOTE — Telephone Encounter (Signed)
Call to patient to review Echo and Chest CT.  Explained to patient echo showed normal pumping function of the heart muscle with mildly thickened heart muscle and increased stiffness of the heart muscle called diastolic dysfunction.  There is mild leakiness of the aortic valve with some mild calcification of the aortic valve.  The aortic root is mildly enlarged and ascending aorta is moderately enlarged.  Patient verbalized understanding and states he was seen by Eligha Bridegroom NP for same in 2023.  Dicussed that Chest CT showed 4.4 cm aortic root dilatation and 4.1 mild ascending aortic dilatation.  There are stable lung nodules and evidence of interstitial lung disease, which patient states he has seen Dr. Vassie Loll for.  Discussed presenc eof fatty liver and small hiatal hernia. Results forwarded to PCP. Patient agrees to repeat chest CTA with contrast gated in 1 year for dilated aorta.

## 2023-03-07 ENCOUNTER — Other Ambulatory Visit: Payer: Self-pay

## 2023-03-07 MED ORDER — AMLODIPINE BESYLATE 5 MG PO TABS: 5.0000 mg | ORAL_TABLET | Freq: Every day | ORAL | 3 refills | Status: DC

## 2023-03-08 DIAGNOSIS — C61 Malignant neoplasm of prostate: Secondary | ICD-10-CM | POA: Diagnosis not present

## 2023-03-14 DIAGNOSIS — C61 Malignant neoplasm of prostate: Secondary | ICD-10-CM | POA: Diagnosis not present

## 2023-03-14 DIAGNOSIS — I10 Essential (primary) hypertension: Secondary | ICD-10-CM | POA: Diagnosis not present

## 2023-03-14 DIAGNOSIS — K219 Gastro-esophageal reflux disease without esophagitis: Secondary | ICD-10-CM | POA: Diagnosis not present

## 2023-03-14 DIAGNOSIS — E7800 Pure hypercholesterolemia, unspecified: Secondary | ICD-10-CM | POA: Diagnosis not present

## 2023-03-14 DIAGNOSIS — I77810 Thoracic aortic ectasia: Secondary | ICD-10-CM | POA: Diagnosis not present

## 2023-03-14 DIAGNOSIS — Z6831 Body mass index (BMI) 31.0-31.9, adult: Secondary | ICD-10-CM | POA: Diagnosis not present

## 2023-03-14 DIAGNOSIS — I700 Atherosclerosis of aorta: Secondary | ICD-10-CM | POA: Diagnosis not present

## 2023-03-14 DIAGNOSIS — J84112 Idiopathic pulmonary fibrosis: Secondary | ICD-10-CM | POA: Diagnosis not present

## 2023-03-20 DIAGNOSIS — C61 Malignant neoplasm of prostate: Secondary | ICD-10-CM | POA: Diagnosis not present

## 2023-03-28 ENCOUNTER — Other Ambulatory Visit: Payer: Self-pay | Admitting: *Deleted

## 2023-03-29 ENCOUNTER — Other Ambulatory Visit: Payer: Self-pay

## 2023-03-29 DIAGNOSIS — E7800 Pure hypercholesterolemia, unspecified: Secondary | ICD-10-CM

## 2023-03-29 MED ORDER — ATORVASTATIN CALCIUM 10 MG PO TABS: 10.0000 mg | ORAL_TABLET | Freq: Every day | ORAL | 3 refills | Status: AC

## 2023-06-13 DIAGNOSIS — R051 Acute cough: Secondary | ICD-10-CM | POA: Diagnosis not present

## 2023-06-13 DIAGNOSIS — J101 Influenza due to other identified influenza virus with other respiratory manifestations: Secondary | ICD-10-CM | POA: Diagnosis not present

## 2023-06-20 DIAGNOSIS — M1712 Unilateral primary osteoarthritis, left knee: Secondary | ICD-10-CM | POA: Diagnosis not present

## 2023-06-20 DIAGNOSIS — M1711 Unilateral primary osteoarthritis, right knee: Secondary | ICD-10-CM | POA: Diagnosis not present

## 2023-06-30 DIAGNOSIS — H524 Presbyopia: Secondary | ICD-10-CM | POA: Diagnosis not present

## 2023-06-30 DIAGNOSIS — H52209 Unspecified astigmatism, unspecified eye: Secondary | ICD-10-CM | POA: Diagnosis not present

## 2023-06-30 DIAGNOSIS — H5203 Hypermetropia, bilateral: Secondary | ICD-10-CM | POA: Diagnosis not present

## 2023-08-19 DIAGNOSIS — Z1379 Encounter for other screening for genetic and chromosomal anomalies: Secondary | ICD-10-CM | POA: Diagnosis not present

## 2023-09-13 DIAGNOSIS — J84112 Idiopathic pulmonary fibrosis: Secondary | ICD-10-CM | POA: Diagnosis not present

## 2023-09-13 DIAGNOSIS — E7800 Pure hypercholesterolemia, unspecified: Secondary | ICD-10-CM | POA: Diagnosis not present

## 2023-09-13 DIAGNOSIS — I77810 Thoracic aortic ectasia: Secondary | ICD-10-CM | POA: Diagnosis not present

## 2023-09-13 DIAGNOSIS — C61 Malignant neoplasm of prostate: Secondary | ICD-10-CM | POA: Diagnosis not present

## 2023-09-13 DIAGNOSIS — N400 Enlarged prostate without lower urinary tract symptoms: Secondary | ICD-10-CM | POA: Diagnosis not present

## 2023-09-13 DIAGNOSIS — J101 Influenza due to other identified influenza virus with other respiratory manifestations: Secondary | ICD-10-CM | POA: Diagnosis not present

## 2023-09-13 DIAGNOSIS — I10 Essential (primary) hypertension: Secondary | ICD-10-CM | POA: Diagnosis not present

## 2023-09-13 DIAGNOSIS — Z6831 Body mass index (BMI) 31.0-31.9, adult: Secondary | ICD-10-CM | POA: Diagnosis not present

## 2023-09-13 DIAGNOSIS — K219 Gastro-esophageal reflux disease without esophagitis: Secondary | ICD-10-CM | POA: Diagnosis not present

## 2023-09-16 DIAGNOSIS — C61 Malignant neoplasm of prostate: Secondary | ICD-10-CM | POA: Diagnosis not present

## 2023-09-16 DIAGNOSIS — E7800 Pure hypercholesterolemia, unspecified: Secondary | ICD-10-CM | POA: Diagnosis not present

## 2023-09-16 DIAGNOSIS — I10 Essential (primary) hypertension: Secondary | ICD-10-CM | POA: Diagnosis not present

## 2023-10-13 DIAGNOSIS — N390 Urinary tract infection, site not specified: Secondary | ICD-10-CM | POA: Diagnosis not present

## 2023-10-16 DIAGNOSIS — C61 Malignant neoplasm of prostate: Secondary | ICD-10-CM | POA: Diagnosis not present

## 2023-10-16 DIAGNOSIS — I10 Essential (primary) hypertension: Secondary | ICD-10-CM | POA: Diagnosis not present

## 2023-10-16 DIAGNOSIS — E7800 Pure hypercholesterolemia, unspecified: Secondary | ICD-10-CM | POA: Diagnosis not present

## 2023-11-16 DIAGNOSIS — E7800 Pure hypercholesterolemia, unspecified: Secondary | ICD-10-CM | POA: Diagnosis not present

## 2023-11-16 DIAGNOSIS — I10 Essential (primary) hypertension: Secondary | ICD-10-CM | POA: Diagnosis not present

## 2023-11-16 DIAGNOSIS — C61 Malignant neoplasm of prostate: Secondary | ICD-10-CM | POA: Diagnosis not present

## 2023-12-11 ENCOUNTER — Other Ambulatory Visit: Payer: Self-pay | Admitting: Cardiology

## 2023-12-13 ENCOUNTER — Other Ambulatory Visit: Payer: Self-pay

## 2023-12-13 MED ORDER — AMLODIPINE BESYLATE 5 MG PO TABS: 5.0000 mg | ORAL_TABLET | Freq: Every day | ORAL | 0 refills | Status: DC

## 2023-12-17 DIAGNOSIS — C61 Malignant neoplasm of prostate: Secondary | ICD-10-CM | POA: Diagnosis not present

## 2023-12-17 DIAGNOSIS — E7800 Pure hypercholesterolemia, unspecified: Secondary | ICD-10-CM | POA: Diagnosis not present

## 2023-12-17 DIAGNOSIS — I10 Essential (primary) hypertension: Secondary | ICD-10-CM | POA: Diagnosis not present

## 2023-12-18 DIAGNOSIS — Z79899 Other long term (current) drug therapy: Secondary | ICD-10-CM | POA: Diagnosis not present

## 2023-12-21 ENCOUNTER — Other Ambulatory Visit: Payer: Self-pay | Admitting: Urology

## 2023-12-21 DIAGNOSIS — C61 Malignant neoplasm of prostate: Secondary | ICD-10-CM

## 2023-12-22 ENCOUNTER — Encounter: Payer: Self-pay | Admitting: Urology

## 2024-01-16 DIAGNOSIS — E7800 Pure hypercholesterolemia, unspecified: Secondary | ICD-10-CM | POA: Diagnosis not present

## 2024-01-16 DIAGNOSIS — C61 Malignant neoplasm of prostate: Secondary | ICD-10-CM | POA: Diagnosis not present

## 2024-01-16 DIAGNOSIS — I10 Essential (primary) hypertension: Secondary | ICD-10-CM | POA: Diagnosis not present

## 2024-02-16 DIAGNOSIS — E7800 Pure hypercholesterolemia, unspecified: Secondary | ICD-10-CM | POA: Diagnosis not present

## 2024-02-16 DIAGNOSIS — C61 Malignant neoplasm of prostate: Secondary | ICD-10-CM | POA: Diagnosis not present

## 2024-02-16 DIAGNOSIS — I10 Essential (primary) hypertension: Secondary | ICD-10-CM | POA: Diagnosis not present

## 2024-02-20 ENCOUNTER — Ambulatory Visit
Admission: RE | Admit: 2024-02-20 | Discharge: 2024-02-20 | Disposition: A | Source: Ambulatory Visit | Attending: Urology

## 2024-02-20 DIAGNOSIS — C61 Malignant neoplasm of prostate: Secondary | ICD-10-CM | POA: Diagnosis not present

## 2024-02-20 MED ORDER — GADOPICLENOL 0.5 MMOL/ML IV SOLN
10.0000 mL | Freq: Once | INTRAVENOUS | Status: AC | PRN
  Administered 2024-02-20: 10 mL via INTRAVENOUS

## 2024-02-20 MED ORDER — GADOTERIDOL 279.3 MG/ML IV SOLN: 10.0000 mL | Freq: Once | INTRAVENOUS | Status: DC | PRN

## 2024-02-21 DIAGNOSIS — H353132 Nonexudative age-related macular degeneration, bilateral, intermediate dry stage: Secondary | ICD-10-CM | POA: Diagnosis not present

## 2024-02-21 DIAGNOSIS — H11823 Conjunctivochalasis, bilateral: Secondary | ICD-10-CM | POA: Diagnosis not present

## 2024-02-21 DIAGNOSIS — Z961 Presence of intraocular lens: Secondary | ICD-10-CM | POA: Diagnosis not present

## 2024-02-21 DIAGNOSIS — H524 Presbyopia: Secondary | ICD-10-CM | POA: Diagnosis not present

## 2024-02-21 DIAGNOSIS — H0100A Unspecified blepharitis right eye, upper and lower eyelids: Secondary | ICD-10-CM | POA: Diagnosis not present

## 2024-02-21 DIAGNOSIS — H0100B Unspecified blepharitis left eye, upper and lower eyelids: Secondary | ICD-10-CM | POA: Diagnosis not present

## 2024-02-21 DIAGNOSIS — H35372 Puckering of macula, left eye: Secondary | ICD-10-CM | POA: Diagnosis not present

## 2024-02-21 DIAGNOSIS — H52203 Unspecified astigmatism, bilateral: Secondary | ICD-10-CM | POA: Diagnosis not present

## 2024-03-04 DIAGNOSIS — R3915 Urgency of urination: Secondary | ICD-10-CM | POA: Diagnosis not present

## 2024-03-04 DIAGNOSIS — C61 Malignant neoplasm of prostate: Secondary | ICD-10-CM | POA: Diagnosis not present

## 2024-03-04 DIAGNOSIS — N401 Enlarged prostate with lower urinary tract symptoms: Secondary | ICD-10-CM | POA: Diagnosis not present

## 2024-03-17 DIAGNOSIS — I10 Essential (primary) hypertension: Secondary | ICD-10-CM | POA: Diagnosis not present

## 2024-03-17 DIAGNOSIS — E7800 Pure hypercholesterolemia, unspecified: Secondary | ICD-10-CM | POA: Diagnosis not present

## 2024-03-17 DIAGNOSIS — C61 Malignant neoplasm of prostate: Secondary | ICD-10-CM | POA: Diagnosis not present

## 2024-03-21 ENCOUNTER — Ambulatory Visit (HOSPITAL_COMMUNITY)
Admission: RE | Admit: 2024-03-21 | Discharge: 2024-03-21 | Disposition: A | Source: Ambulatory Visit | Attending: Cardiology | Admitting: Cardiology

## 2024-03-21 DIAGNOSIS — R918 Other nonspecific abnormal finding of lung field: Secondary | ICD-10-CM | POA: Diagnosis not present

## 2024-03-21 DIAGNOSIS — Z01810 Encounter for preprocedural cardiovascular examination: Secondary | ICD-10-CM | POA: Diagnosis not present

## 2024-03-21 DIAGNOSIS — J479 Bronchiectasis, uncomplicated: Secondary | ICD-10-CM | POA: Diagnosis not present

## 2024-03-21 DIAGNOSIS — I779 Disorder of arteries and arterioles, unspecified: Secondary | ICD-10-CM | POA: Diagnosis not present

## 2024-03-21 DIAGNOSIS — I77810 Thoracic aortic ectasia: Secondary | ICD-10-CM | POA: Diagnosis not present

## 2024-03-21 MED ORDER — IOHEXOL 350 MG/ML SOLN
75.0000 mL | Freq: Once | INTRAVENOUS | Status: AC | PRN
  Administered 2024-03-21: 75 mL via INTRAVENOUS

## 2024-03-28 ENCOUNTER — Encounter: Payer: Self-pay | Admitting: Cardiology

## 2024-03-28 ENCOUNTER — Ambulatory Visit: Payer: Self-pay | Admitting: Cardiology

## 2024-04-05 NOTE — Telephone Encounter (Signed)
-----   Message from Wilbert Bihari, MD sent at 03/28/2024  8:25 PM EST ----- Chest CT showed mildly dilated aortic root at the level of the sinuses of Valsalva (4.4cm) and mildly dilated ascending aorta (4.1cm). No change in aortic dimensions compared to 02/2023.  Calcifications of the LAD and mitral annulus. Small hiatal hernia.  Interstitial lung disease mildly progressed from 2023 but stable over the past year - forwar to Dr. Jude for review of pulmonary findings

## 2024-04-05 NOTE — Telephone Encounter (Signed)
 Call to patient to advise chest CT showed mildly dilated aortic root at the level of the sinuses of Valsalva (4.4cm) and mildly dilated ascending aorta (4.1cm). Patient verbalizes no change in aortic dimensions compared to 02/2023. Calcifications of the LAD and mitral annulus. Small hiatal hernia also present. Interstitial lung disease mildly progressed from 2023 but stable over the past year -advised Dr. Shlomo recommends that results be forwarded to Dr. Jude for review of pulmonary findings. Patient agrees to plan.

## 2024-04-16 NOTE — Progress Notes (Signed)
 Left pt message to return call on VM

## 2024-04-19 NOTE — Progress Notes (Signed)
 Left pt message to return call

## 2024-05-06 ENCOUNTER — Other Ambulatory Visit: Payer: Self-pay | Admitting: Cardiology

## 2024-08-29 ENCOUNTER — Ambulatory Visit: Admitting: Cardiology
